# Patient Record
Sex: Female | Born: 1975 | Hispanic: Yes | Marital: Married | Smoking: Never smoker
Health system: Southern US, Community
[De-identification: ages and names within clinical notes are randomized; demographics above are authoritative.]

## PROBLEM LIST (undated history)

## (undated) DIAGNOSIS — M1990 Unspecified osteoarthritis, unspecified site: Secondary | ICD-10-CM

## (undated) DIAGNOSIS — I10 Essential (primary) hypertension: Secondary | ICD-10-CM

## (undated) HISTORY — PX: APPENDECTOMY: SHX54

---

## 2006-01-19 ENCOUNTER — Ambulatory Visit: Payer: Self-pay | Admitting: *Deleted

## 2006-01-19 ENCOUNTER — Ambulatory Visit: Payer: Self-pay | Admitting: Family Medicine

## 2006-02-08 ENCOUNTER — Encounter (INDEPENDENT_AMBULATORY_CARE_PROVIDER_SITE_OTHER): Payer: Self-pay | Admitting: Family Medicine

## 2006-02-08 ENCOUNTER — Ambulatory Visit: Payer: Self-pay | Admitting: Family Medicine

## 2006-12-15 ENCOUNTER — Ambulatory Visit: Payer: Self-pay | Admitting: Family Medicine

## 2007-02-16 ENCOUNTER — Ambulatory Visit: Payer: Self-pay | Admitting: Internal Medicine

## 2007-02-16 ENCOUNTER — Encounter (INDEPENDENT_AMBULATORY_CARE_PROVIDER_SITE_OTHER): Payer: Self-pay | Admitting: Internal Medicine

## 2007-02-16 ENCOUNTER — Encounter (INDEPENDENT_AMBULATORY_CARE_PROVIDER_SITE_OTHER): Payer: Self-pay | Admitting: Family Medicine

## 2007-04-27 ENCOUNTER — Ambulatory Visit: Payer: Self-pay | Admitting: Internal Medicine

## 2007-05-19 ENCOUNTER — Ambulatory Visit: Payer: Self-pay | Admitting: Family Medicine

## 2007-05-20 ENCOUNTER — Encounter (INDEPENDENT_AMBULATORY_CARE_PROVIDER_SITE_OTHER): Payer: Self-pay | Admitting: Nurse Practitioner

## 2007-05-20 LAB — CONVERTED CEMR LAB
Cholesterol: 233 mg/dL — ABNORMAL HIGH (ref 0–200)
Total CHOL/HDL Ratio: 4.4
Triglycerides: 172 mg/dL — ABNORMAL HIGH (ref <150)
VLDL: 34 mg/dL (ref 0–40)

## 2007-05-22 ENCOUNTER — Encounter (INDEPENDENT_AMBULATORY_CARE_PROVIDER_SITE_OTHER): Payer: Self-pay | Admitting: Family Medicine

## 2007-05-22 DIAGNOSIS — K219 Gastro-esophageal reflux disease without esophagitis: Secondary | ICD-10-CM

## 2007-05-22 DIAGNOSIS — G43009 Migraine without aura, not intractable, without status migrainosus: Secondary | ICD-10-CM | POA: Insufficient documentation

## 2007-05-22 DIAGNOSIS — E782 Mixed hyperlipidemia: Secondary | ICD-10-CM | POA: Insufficient documentation

## 2007-06-14 ENCOUNTER — Encounter (INDEPENDENT_AMBULATORY_CARE_PROVIDER_SITE_OTHER): Payer: Self-pay | Admitting: *Deleted

## 2009-09-10 ENCOUNTER — Telehealth (INDEPENDENT_AMBULATORY_CARE_PROVIDER_SITE_OTHER): Payer: Self-pay | Admitting: Internal Medicine

## 2009-09-25 ENCOUNTER — Encounter (INDEPENDENT_AMBULATORY_CARE_PROVIDER_SITE_OTHER): Payer: Self-pay | Admitting: *Deleted

## 2009-09-30 ENCOUNTER — Ambulatory Visit: Payer: Self-pay | Admitting: Internal Medicine

## 2009-09-30 DIAGNOSIS — J309 Allergic rhinitis, unspecified: Secondary | ICD-10-CM | POA: Insufficient documentation

## 2009-09-30 DIAGNOSIS — R5381 Other malaise: Secondary | ICD-10-CM

## 2009-09-30 DIAGNOSIS — R5383 Other fatigue: Secondary | ICD-10-CM

## 2009-09-30 LAB — CONVERTED CEMR LAB
ALT: 14 units/L (ref 0–35)
AST: 16 units/L (ref 0–37)
Albumin: 4.1 g/dL (ref 3.5–5.2)
Alkaline Phosphatase: 67 units/L (ref 39–117)
BUN: 8 mg/dL (ref 6–23)
Basophils Absolute: 0 10*3/uL (ref 0.0–0.1)
Basophils Relative: 0 % (ref 0–1)
CO2: 21 meq/L (ref 19–32)
Calcium: 8.9 mg/dL (ref 8.4–10.5)
Chloride: 106 meq/L (ref 96–112)
Cholesterol: 230 mg/dL — ABNORMAL HIGH (ref 0–200)
Creatinine, Ser: 0.58 mg/dL (ref 0.40–1.20)
Eosinophils Absolute: 0.2 10*3/uL (ref 0.0–0.7)
Eosinophils Relative: 3 % (ref 0–5)
Glucose, Bld: 95 mg/dL (ref 70–99)
HCT: 40 % (ref 36.0–46.0)
HDL: 55 mg/dL (ref >39)
Hemoglobin: 12.4 g/dL (ref 12.0–15.0)
LDL Cholesterol: 153 mg/dL — ABNORMAL HIGH (ref 0–99)
Lymphocytes Relative: 24 % (ref 12–46)
Lymphs Abs: 1.4 10*3/uL (ref 0.7–4.0)
MCHC: 31 g/dL (ref 30.0–36.0)
MCV: 85.7 fL (ref 78.0–100.0)
Monocytes Absolute: 0.4 10*3/uL (ref 0.1–1.0)
Monocytes Relative: 7 % (ref 3–12)
Neutro Abs: 3.8 10*3/uL (ref 1.7–7.7)
Neutrophils Relative %: 66 % (ref 43–77)
Platelets: 354 10*3/uL (ref 150–400)
Potassium: 4.2 meq/L (ref 3.5–5.3)
RBC: 4.67 M/uL (ref 3.87–5.11)
RDW: 14.9 % (ref 11.5–15.5)
Sodium: 140 meq/L (ref 135–145)
TSH: 0.935 microintl units/mL (ref 0.350–4.500)
Total Bilirubin: 0.4 mg/dL (ref 0.3–1.2)
Total CHOL/HDL Ratio: 4.2
Total Protein: 6.7 g/dL (ref 6.0–8.3)
Triglycerides: 109 mg/dL (ref <150)
VLDL: 22 mg/dL (ref 0–40)
WBC: 5.8 10*3/uL (ref 4.0–10.5)

## 2009-10-21 ENCOUNTER — Encounter (INDEPENDENT_AMBULATORY_CARE_PROVIDER_SITE_OTHER): Payer: Self-pay | Admitting: Internal Medicine

## 2009-11-12 ENCOUNTER — Ambulatory Visit: Payer: Self-pay | Admitting: Internal Medicine

## 2009-11-12 DIAGNOSIS — K1120 Sialoadenitis, unspecified: Secondary | ICD-10-CM

## 2009-12-16 ENCOUNTER — Ambulatory Visit: Payer: Self-pay | Admitting: Internal Medicine

## 2009-12-16 DIAGNOSIS — M25569 Pain in unspecified knee: Secondary | ICD-10-CM

## 2009-12-16 LAB — CONVERTED CEMR LAB
Blood in Urine, dipstick: NEGATIVE
Glucose, Urine, Semiquant: NEGATIVE
Nitrite: NEGATIVE
Pap Smear: NEGATIVE
Protein, U semiquant: NEGATIVE
Urobilinogen, UA: 0.2
WBC Urine, dipstick: NEGATIVE
Whiff Test: NEGATIVE

## 2009-12-21 ENCOUNTER — Encounter (INDEPENDENT_AMBULATORY_CARE_PROVIDER_SITE_OTHER): Payer: Self-pay | Admitting: Internal Medicine

## 2009-12-21 LAB — CONVERTED CEMR LAB: GC Probe Amp, Genital: NEGATIVE

## 2010-10-28 NOTE — Assessment & Plan Note (Signed)
Summary: cpp exam///gk   Vital Signs:  Patient profile:   35 year old female LMP:     12/09/2009 Weight:      175.25 pounds BMI:     31.66 Temp:     97.6 degrees F Pulse rate:   73 / minute Pulse rhythm:   regular Resp:     16 per minute BP sitting:   132 / 87  (left arm) Cuff size:   regular  Vitals Entered By: Chauncy Passy, SMA CC: Pt. is here for a 35 y/o CPP.  Is Patient Diabetic? No Pain Assessment Patient in pain? no       Does patient need assistance? Ambulation Normal LMP (date): 12/09/2009     Enter LMP: 12/09/2009 Last PAP Result Normal   CC:  Pt. is here for a 35 y/o CPP. Marland Kitchen  History of Present Illness: 35 yo female here for CPP.  Concerns:  1.  Allergies:  more symptoms today--runny nose and sneezing.  Not using Fexofenadine currently, but still has some.  Using nasal spray and Singulair daily.  2.  Right facial swelling:  Possible sialadenitis, but no pain with eating.  Resolved after 1 week.  Tolerated Augmentin fine.  Pt. states did have a "bubble " come up on her gum in that area and that drained pus.    Habits & Providers  Alcohol-Tobacco-Diet     Alcohol drinks/day: <1     Alcohol type: rare beer     Tobacco Status: never  Exercise-Depression-Behavior     Drug Use: never  Current Medications (verified): 1)  Fexofenadine Hcl 180 Mg Tabs (Fexofenadine Hcl) .Marland Kitchen.. 1 Tab By Mouth Daily As Needed Allergies 2)  Augmentin 875-125 Mg Tabs (Amoxicillin-Pot Clavulanate) .Marland Kitchen.. 1 Tab By Mouth Two Times A Day For 10 Days 3)  Singulair 10 Mg Tabs (Montelukast Sodium) .Marland Kitchen.. 1 Tab By Mouth Daily 4)  Nasacort Aq 55 Mcg/act Aers (Triamcinolone Acetonide(Nasal)) .... 2 Sprays Each Nostril Once Daily  Allergies (verified): No Known Drug Allergies  Past History:  Past Medical History: SIALADENITIS, RIGHT (ICD-527.2) ALLERGIC RHINITIS WITH CONJUNCTIVITIS (ICD-477.9) FATIGUE (ICD-780.79) HYPERLIPIDEMIA, MIXED (ICD-272.2) GERD (ICD-530.81) COMMON MIGRAINE  (ICD-346.10)  Past Surgical History: 1.  Appendectomy  Family History: Mother, died 71:  Stroke, hypertension. Father, died 44:  ?cancer--died of unknown respiratory illness, hypercholesterolemia, possible CAD 10 sisters, 1 died age 44:  seizure disorder--complications following a seizure. 1 Brother,   50+ , Hypercholesterolemia Daughter, 65:  Healthy  Social History: Moved to [place]. in 2003. 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 Lives at home with husband and daughter.Smoking Status:  never Drug Use:  never  Review of Systems General:  Energy generally fine.. Eyes:  Denies blurring. ENT:  Denies decreased hearing. CV:  Denies chest pain or discomfort; Rare episode of palpitation--seconds. Resp:  Denies shortness of breath; one time episode of difficulties getting breath--short lived.  When repositioned herself, was fine.Marland Kitchen GI:  Denies abdominal pain, bloody stools, constipation, dark tarry stools, and diarrhea. GU:  Denies discharge, dysuria, and urinary frequency; Periods regular.   LMP:  12/09/09.. MS:  Sometimes with pain and swelling of right elbow and left knee.  No erythenma.. Derm:  Denies lesion(s) and rash. Neuro:  Denies numbness, tingling, and weakness. Psych:  PHQ-9 with score of 3.  Physical Exam  General:  Well-developed,well-nourished,in no acute distress; alert,appropriate and cooperative throughout examination Head:  Normocephalic and atraumatic without obvious abnormalities. No apparent alopecia or balding. Eyes:  No corneal or conjunctival inflammation  noted. EOMI. Perrla. Funduscopic exam benign, without hemorrhages, exudates or papilledema. Vision grossly normal. Ears:  External ear exam shows no significant lesions or deformities.  Otoscopic examination reveals clear canals, tympanic membranes are intact bilaterally without bulging, retraction, inflammation or discharge. Hearing is grossly normal bilaterally. Nose:  External nasal examination shows no  deformity or inflammation. Nasal mucosa are pink and moist without lesions or exudates. Mouth:  Oral mucosa and oropharynx without lesions or exudates.  Teeth in good repair. Neck:  No deformities, masses, or tenderness noted. Breasts:  No mass, nodules, thickening, tenderness, bulging, retraction, inflamation, nipple discharge or skin changes noted.   Lungs:  Normal respiratory effort, chest expands symmetrically. Lungs are clear to auscultation, no crackles or wheezes. Heart:  Normal rate and regular rhythm. S1 and S2 normal without gallop, murmur, click, rub or other extra sounds. Abdomen:  Bowel sounds positive,abdomen soft and non-tender without masses, organomegaly or hernias noted. Genitalia:  Pelvic Exam:        External: normal female genitalia without lesions or masses        Vagina: normal without lesions or masses        Cervix: normal without lesions or masses        Adnexa: normal bimanual exam without masses or fullness        Uterus: normal by palpation        Pap smear: performed Msk:  No deformity or scoliosis noted of thoracic or lumbar spine.   Extremities:  No effusion or swelling of right elbow or left knee.  Full ROM.   MIld tenderness over medial epicondyle on right elbow.   Pt. points to tendons of hamstrings on lateral left knee as source of pain--nontender currently Neurologic:  No cranial nerve deficits noted. Station and gait are normal. Plantar reflexes are down-going bilaterally. DTRs are symmetrical throughout. Sensory, motor and coordinative functions appear intact. Skin:  Intact without suspicious lesions or rashes Cervical Nodes:  No lymphadenopathy noted Axillary Nodes:  No palpable lymphadenopathy Inguinal Nodes:  No significant adenopathy Psych:  Cognition and judgment appear intact. Alert and cooperative with normal attention span and concentration. No apparent delusions, illusions, hallucinations   Impression & Recommendations:  Problem # 1:   ROUTINE GYNECOLOGICAL EXAMINATION (ICD-V72.31) Monthly SBE Orders: KOH/ WET Mount [phone]) Pap Smear, Thin Prep ( Collection of) [phone]) UA Dipstick w/o Micro (manual) (09811) T- GC Chlamydia (91478) T-HIV Antibody  (Reflex) ([id number]) T-Pap Smear, Thin Prep (57846) T-Syphilis Test (RPR) (96295-28413)  Problem # 2:  SIALADENITIS, RIGHT (ICD-527.2) Resolved--more likely an abscessed tooth now with new history  Problem # 3:  KNEE PAIN, LEFT (ICD-719.46) ibuprofen as needed--believe this is tendonitis  Problem # 4:  ALLERGIC RHINITIS WITH CONJUNCTIVITIS (ICD-477.9) To start antihistamine Her updated medication list for this problem includes:    Xyzal 5 Mg Tabs (Levocetirizine dihydrochloride) .Marland Kitchen... 1 tab by mouth daily as needed allergies    Nasacort Aq 55 Mcg/act Aers (Triamcinolone acetonide(nasal)) .Marland Kitchen... 2 sprays each nostril once daily  Problem # 5:  HYPERLIPIDEMIA, MIXED (ICD-272.2) To work on diet and exercise and recheck.  Complete Medication List: 1)  Xyzal 5 Mg Tabs (Levocetirizine dihydrochloride) .Marland Kitchen.. 1 tab by mouth daily as needed allergies 2)  Singulair 10 Mg Tabs (Montelukast sodium) .Marland Kitchen.. 1 tab by mouth daily 3)  Nasacort Aq 55 Mcg/act Aers (Triamcinolone acetonide(nasal)) .... 2 sprays each nostril once daily  Other Orders: Tdap => 85yrs IM [phone]) Admin 1st Vaccine (02725) Admin 1st Vaccine [facility]) [phone])  Patient  Instructions: 1)  Ibuprofen 400-600 mg every 6 hours with food for elbow or knee pain--call if worsens 2)  Fasting lipids in 4 months--lab only 3)  physcial in 1 year-Dr. Delrae Alfred Prescriptions: XYZAL 5 MG TABS (LEVOCETIRIZINE DIHYDROCHLORIDE) 1 tab by mouth daily as needed allergies  #30 x 11   Entered and Authorized by:   Julieanne Manson MD   Signed by:   Julieanne Manson MD on 12/16/2009   Method used:   Faxed to ...       [facility] (retail)       [address] [place], [place]  [address]        Ph: [phone]       Fax: [phone]   RxID:   [id number]   Laboratory Results   Urine Tests    Routine Urinalysis   Glucose: negative   (Normal Range: Negative) Bilirubin: negative   (Normal Range: Negative) Ketone: negative   (Normal Range: Negative) Spec. Gravity: 1.010   (Normal Range: 1.003-1.035) Blood: negative   (Normal Range: Negative) pH: 5.5   (Normal Range: 5.0-8.0) Protein: negative   (Normal Range: Negative) Urobilinogen: 0.2   (Normal Range: 0-1) Nitrite: negative   (Normal Range: Negative) Leukocyte Esterace: negative   (Normal Range: Negative)      Wet Mount Source: vaginal WBC/hpf: 1-5 Bacteria/hpf: 1+ Clue cells/hpf: none  Negative whiff Yeast/hpf: none Wet Mount KOH: Negative Trichomonas/hpf: none    Preventive Care Screening  Prior Values:    Pap Smear:  Normal (02/16/2007)     Pap:  always normal SBE:  monthly--no changes. Osteoprevention:  2 + servings of dairy daily.  Walks daily.   Tetanus/Td Vaccine    Vaccine Type: Tdap    Site: right deltoid    Mfr: Sanofi Pasteur    Dose: 0.5 ml    Route: IM    Given by: Chauncy Passy, SMA    Exp. Date: 01/02/2012    Lot #: [id number]    VIS given: 08/15/07 version given December 16, 2009.   Laboratory Results   Urine Tests    Routine Urinalysis   Glucose: negative   (Normal Range: Negative) Bilirubin: negative   (Normal Range: Negative) Ketone: negative   (Normal Range: Negative) Spec. Gravity: 1.010   (Normal Range: 1.003-1.035) Blood: negative   (Normal Range: Negative) pH: 5.5   (Normal Range: 5.0-8.0) Protein: negative   (Normal Range: Negative) Urobilinogen: 0.2   (Normal Range: 0-1) Nitrite: negative   (Normal Range: Negative) Leukocyte Esterace: negative   (Normal Range: Negative)      Wet Mount/KOH  Negative whiff   Appended Document: cpp exam///gk  Laboratory Results    Other Tests  Rapid HIV: negative

## 2010-10-28 NOTE — Assessment & Plan Note (Signed)
Summary: allergies/ abdominal pain/ nasal congestion//gk   Vital Signs:  Patient profile:   35 year old female Height:      62.5 inches Weight:      179 pounds BMI:     32.33 Temp:     98.0 degrees F Pulse rate:   84 / minute Pulse rhythm:   regular Resp:     18 per minute BP sitting:   135 / 86  (left arm) Cuff size:   regular  Vitals Entered By: Vesta Mixer CMA (September 30, 2009 9:37 AM) CC: Allergies 1-2 times a weeks.  Eyes watery, sneezing, runny nose and face feels hot for about 2 months.  Has h/o high cholesterol, she is fasting this morning.  It's been about 2 years since her last pap. Is Patient Diabetic? No Pain Assessment Patient in pain? no       Does patient need assistance? Ambulation Normal   CC:  Allergies 1-2 times a weeks.  Eyes watery, sneezing, runny nose and face feels hot for about 2 months.  Has h/o high cholesterol, and she is fasting this morning.  It's been about 2 years since her last pap.Marland Kitchen  History of Present Illness: 35 yo female I am seeing for first time in probably 2 1/2 years.  1.  Allergies:  Clear nasal discharge, face gets hot, sinus pressure and discomfort--head feels stuffed up.  Posterior pharyngeal drainage also, sometimes cough, does have itchy watery eyes and sneezing as well.  Currently not having much in way of symptoms.  Generally has symptoms in the summer, but in last 2 months -- more.  Really has not had problems with this until this year.  Benadryl does help, but does not like side effects.    2.  Hypercholesterolemia:  would like to check today.  3.  Needs CPP for pap--see above.  4.  Fatigue:  brings up at end of visit--has had for 2-3 years--always tired.  Periods getting heavier.  Allergies (verified): No Known Drug Allergies  Physical Exam  General:  NAD Eyes:  No injection. Ears:  External ear exam shows no significant lesions or deformities.  Otoscopic examination reveals clear canals, tympanic membranes are  intact bilaterally without bulging, retraction, inflammation or discharge. Hearing is grossly normal bilaterally. Nose:  mucosal edema.   Mouth:  pharynx pink and moist.   Neck:  No deformities, masses, or tenderness noted. Lungs:  Normal respiratory effort, chest expands symmetrically. Lungs are clear to auscultation, no crackles or wheezes. Heart:  Normal rate and regular rhythm. S1 and S2 normal without gallop, murmur, click, rub or other extra sounds.   Impression & Recommendations:  Problem # 1:  ALLERGIC RHINITIS WITH CONJUNCTIVITIS (ICD-477.9) Start with Fexofendadine Her updated medication list for this problem includes:    Fexofenadine Hcl 180 Mg Tabs (Fexofenadine hcl) .Marland Kitchen... 1 tab by mouth daily as needed allergies  Problem # 2:  HYPERLIPIDEMIA, MIXED (ICD-272.2)  Orders: T-Lipid Profile ([id number])  Problem # 3:  FATIGUE (ICD-780.79) Address more fully at CPP Orders: T-Comprehensive Metabolic Panel (09811-91478) T-CBC w/Diff (29562-13086) T-TSH (57846-96295)  Complete Medication List: 1)  Fexofenadine Hcl 180 Mg Tabs (Fexofenadine hcl) .Marland Kitchen.. 1 tab by mouth daily as needed allergies  Patient Instructions: 1)  Next available with Dr. Delrae Alfred for CPP Prescriptions: FEXOFENADINE HCL 180 MG TABS (FEXOFENADINE HCL) 1 tab by mouth daily as needed allergies  #30 x 11   Entered and Authorized by:   Julieanne Manson MD   Signed by:  Julieanne Manson MD on 09/30/2009   Method used:   Faxed to ...       [facility] (retail)       [address] [place], [place]  [address]       Ph: [phone]       Fax: [phone]   RxID:   [id number]

## 2010-10-28 NOTE — Assessment & Plan Note (Signed)
Summary: ALLERGIES//GK   Vital Signs:  Patient profile:   35 year old female Weight:      179 pounds Temp:     97.6 degrees F Pulse rate:   80 / minute Pulse rhythm:   regular Resp:     18 per minute BP sitting:   131 / 84  (left arm) Cuff size:   regular  Vitals Entered By: Vesta Mixer CMA (November 12, 2009 11:36 AM) CC: sneezing, watery eyes, lots of nasal congestion, was here 09/30/09  for same thing, but no better.  Allegra has not helped that she can tell. Is Patient Diabetic? No Pain Assessment Patient in pain? no       Does patient need assistance? Ambulation Normal   CC:  sneezing, watery eyes, lots of nasal congestion, was here 09/30/09  for same thing, and but no better.  Allegra has not helped that she can tell.Marland Kitchen  History of Present Illness: 1.  Allergic Rhinitis:  no improvement with Allegra.  States her symptoms just seem to come and go--even throughout a day.  Later, becomes evident she has been using Afrin, but not clear that she is using it very frequently.  Her main concern is the nasal congestion--having to breath through her mouth.  Lips getting very dry because of this.  2.  Hyperlipidemia:    3.  Health maintenance:  Has an appt. 3/22 for CPP.   4.  Some swelling in right cheek, but no toothache or other pain.  Has noted for past 2-3 days.   No fever  Allergies (verified): No Known Drug Allergies  Physical Exam  Eyes:  No conjunctival injection Ears:  External ear exam shows no significant lesions or deformities.  Otoscopic examination reveals clear canals, tympanic membranes are intact bilaterally without bulging, retraction, inflammation or discharge. Hearing is grossly normal bilaterally. Nose:  nasal dischargemucosal pallor.   Mouth:  Tender thin thickening running from maxillary area of right cheek to opening of parotid duct opening.  No drainage from parotid duct opening. Neck:  No deformities, masses, or tenderness noted. Lungs:  Normal  respiratory effort, chest expands symmetrically. Lungs are clear to auscultation, no crackles or wheezes. Heart:  Normal rate and regular rhythm. S1 and S2 normal without gallop, murmur, click, rub or other extra sounds.   Impression & Recommendations:  Problem # 1:  ALLERGIC RHINITIS WITH CONJUNCTIVITIS (ICD-477.9) Stop Afrin Her updated medication list for this problem includes:    Fexofenadine Hcl 180 Mg Tabs (Fexofenadine hcl) .Marland Kitchen... 1 tab by mouth daily as needed allergies    Nasacort Aq 55 Mcg/act Aers (Triamcinolone acetonide(nasal)) .Marland Kitchen... 2 sprays each nostril once daily  Problem # 2:  HYPERLIPIDEMIA, MIXED (ICD-272.2) To work on diet and exercise. Repeat 4 months Orders: Nutrition Referral (Nutrition)  Problem # 3:  SIALADENITIS, RIGHT (ICD-527.2) Augmentins and warm packs--not clear this is the actual problem, but does seem to have swelling along parotid duct. To call if does not resolve Eat lemons  Complete Medication List: 1)  Fexofenadine Hcl 180 Mg Tabs (Fexofenadine hcl) .Marland Kitchen.. 1 tab by mouth daily as needed allergies 2)  Augmentin 875-125 Mg Tabs (Amoxicillin-pot clavulanate) .Marland Kitchen.. 1 tab by mouth two times a day for 10 days 3)  Singulair 10 Mg Tabs (Montelukast sodium) .Marland Kitchen.. 1 tab by mouth daily 4)  Nasacort Aq 55 Mcg/act Aers (Triamcinolone acetonide(nasal)) .... 2 sprays each nostril once daily  Patient Instructions: 1)  Keep appt. in March with Dr. Delrae Alfred 2)  Stop  Afrin Prescriptions: NASACORT AQ 55 MCG/ACT AERS (TRIAMCINOLONE ACETONIDE(NASAL)) 2 sprays each nostril once daily  #1 month x 11   Entered and Authorized by:   Julieanne Manson MD   Signed by:   Julieanne Manson MD on 11/12/2009   Method used:   Faxed to ...       [facility] (retail)       [address] [place], [place]  [address]       Ph: [phone]       Fax: [phone]   RxID:   [id number] SINGULAIR 10 MG TABS (MONTELUKAST SODIUM) 1  tab by mouth daily  #30 x 11   Entered and Authorized by:   Julieanne Manson MD   Signed by:   Julieanne Manson MD on 11/12/2009   Method used:   Faxed to ...       [facility] (retail)       [address] [place], [place]  [address]       Ph: [phone]       Fax: [phone]   RxID:   [id number] AUGMENTIN 875-125 MG TABS (AMOXICILLIN-POT CLAVULANATE) 1 tab by mouth two times a day for 10 days  #20 x 0   Entered and Authorized by:   Julieanne Manson MD   Signed by:   Julieanne Manson MD on 11/12/2009   Method used:   Faxed to ...       [facility] (retail)       [address] [place], [place]  [address]       Ph: [phone]       Fax: [phone]   RxID:   [id number]

## 2010-10-28 NOTE — Letter (Signed)
Summary: Lipid Letter  [facility]  [address] [place], [place] [address]   Phone: [phone]  Fax: [phone]    10/21/2009  Kayla Patton [address] [place], [place]  [address]  Dear Kayla Patton:  We have carefully reviewed your last lipid profile from 09/30/2009 and the results are noted below with a summary of recommendations for lipid management.    Cholesterol:       230     Goal: <200   HDL "good" Cholesterol:   55     Goal: >45   LDL "bad" Cholesterol:   153     Goal: <100   Triglycerides:       109     Goal: <150    Your cholesterol is high, the rest of your labs were okay--no findings for fatigue.  Will discuss more when you come in for your physical/pap    TLC Diet (Therapeutic Lifestyle Change): Saturated Fats & Transfatty acids should be kept < 7% of total calories ***Reduce Saturated Fats Polyunstaurated Fat can be up to 10% of total calories Monounsaturated Fat Fat can be up to 20% of total calories Total Fat should be no greater than 25-35% of total calories Carbohydrates should be 50-60% of total calories Protein should be approximately 15% of total calories Fiber should be at least 20-30 grams a day ***Increased fiber may help lower LDL Total Cholesterol should be < 200mg /day Consider adding plant stanol/sterols to diet (example: Benacol spread) ***A higher intake of unsaturated fat may reduce Triglycerides and Increase HDL    Adjunctive Measures (may lower LIPIDS and reduce risk of Heart Attack) include: Aerobic Exercise (20-30 minutes 3-4 times a week) Limit Alcohol Consumption Weight Reduction Aspirin 75-81 mg a day by mouth (if not allergic or contraindicated) Dietary Fiber 20-30 grams a day by mouth     Current Medications: 1)    Fexofenadine Hcl 180 Mg Tabs (Fexofenadine hcl) .Marland Kitchen.. 1 tab by mouth daily as needed allergies  If you have any questions, please call. We appreciate being able to work with you.    Sincerely,    [facility]

## 2010-10-28 NOTE — Progress Notes (Signed)
Summary: Office Visit//DEPRESSION SCREENING  Office Visit//DEPRESSION SCREENING   Imported By: Arta Bruce 02/19/2010 15:28:03  _____________________________________________________________________  External Attachment:    Type:   Image     Comment:   External Document

## 2010-10-28 NOTE — Letter (Signed)
Summary: *HSN Results Follow up  [facility]  [address] [place], [place] [address]   Phone: [phone]  Fax: [phone]      12/21/2009   [facility] RENTERIA ARZATE [address] [place], [place]  [address]   Dear  Ms. Nysia RENTERIA ARZATE,                            ____S.Drinkard,FNP   ____D. Gore,FNP       ____B. McPherson,MD   ____V. Rankins,MD    _X___E. Tongela Encinas,MD    ____N. Daphine Deutscher, FNP  ____D. Reche Dixon, MD    ____K. Philipp Deputy, MD    ____Other     This letter is to inform you that your recent test(s):  _____X__Pap Smear    ___X____Lab Test     _______X-ray    ___X____ is within acceptable limits  _______ requires a medication change  _______ requires a follow-up lab visit  _______ requires a follow-up visit with your provider   Comments:       _________________________________________________________ If you have any questions, please contact our office                     Sincerely,  Julieanne Manson MD [facility]

## 2014-06-03 ENCOUNTER — Emergency Department (HOSPITAL_COMMUNITY): Payer: Self-pay

## 2014-06-03 ENCOUNTER — Encounter (HOSPITAL_COMMUNITY): Payer: Self-pay | Admitting: Emergency Medicine

## 2014-06-03 ENCOUNTER — Emergency Department (HOSPITAL_COMMUNITY)
Admission: EM | Admit: 2014-06-03 | Discharge: 2014-06-04 | Disposition: A | Discharge status: Home / Self Care | Payer: Self-pay | Attending: Emergency Medicine | Admitting: Emergency Medicine

## 2014-06-03 DIAGNOSIS — M7120 Synovial cyst of popliteal space [Baker], unspecified knee: Secondary | ICD-10-CM | POA: Insufficient documentation

## 2014-06-03 DIAGNOSIS — Z79899 Other long term (current) drug therapy: Secondary | ICD-10-CM | POA: Insufficient documentation

## 2014-06-03 DIAGNOSIS — M25569 Pain in unspecified knee: Secondary | ICD-10-CM | POA: Insufficient documentation

## 2014-06-03 DIAGNOSIS — M25461 Effusion, right knee: Secondary | ICD-10-CM

## 2014-06-03 DIAGNOSIS — M129 Arthropathy, unspecified: Secondary | ICD-10-CM | POA: Insufficient documentation

## 2014-06-03 DIAGNOSIS — M7121 Synovial cyst of popliteal space [Baker], right knee: Secondary | ICD-10-CM

## 2014-06-03 DIAGNOSIS — M25561 Pain in right knee: Secondary | ICD-10-CM

## 2014-06-03 DIAGNOSIS — I10 Essential (primary) hypertension: Secondary | ICD-10-CM | POA: Insufficient documentation

## 2014-06-03 DIAGNOSIS — Z791 Long term (current) use of non-steroidal anti-inflammatories (NSAID): Secondary | ICD-10-CM | POA: Insufficient documentation

## 2014-06-03 DIAGNOSIS — E669 Obesity, unspecified: Secondary | ICD-10-CM | POA: Insufficient documentation

## 2014-06-03 HISTORY — DX: Essential (primary) hypertension: I10

## 2014-06-03 HISTORY — DX: Unspecified osteoarthritis, unspecified site: M19.90

## 2014-06-03 MED ORDER — OXYCODONE-ACETAMINOPHEN 5-325 MG PO TABS
1.0000 | ORAL_TABLET | Freq: Once | ORAL | Status: AC
  Administered 2014-06-03: 1 via ORAL
  Filled 2014-06-03: qty 1

## 2014-06-03 NOTE — ED Notes (Signed)
Per pt family pt has been having right knee pain and swelling x 2 weeks. sts hx of arthritis.

## 2014-06-03 NOTE — ED Provider Notes (Signed)
CSN: [id number]     Arrival date & time 06/03/14  1810 History   First MD Initiated Contact with Patient 06/03/14 2259     This chart was scribed for non-physician practitioner, Kayla Pontes PA-C  working with Kayla Patton, * by Kayla Patton, ED Scribe. This patient was seen in room TR07C/TR07C and the patient's care was started at 1:02 AM.   Chief Complaint  Patient presents with  . Knee Pain   Patient is a 38 y.o. female presenting with knee pain. The history is provided by the patient. A language interpreter was used (provider).  Knee Pain Location:  Knee Injury: no   Knee location:  L knee and R knee (mostly R knee, but ongoing pain in L knee) Pain details:    Quality:  Aching   Radiates to:  Does not radiate   Severity:  Moderate   Onset quality:  Gradual   Duration:  2 weeks   Timing:  Constant   Progression:  Unchanged Chronicity:  Recurrent Dislocation: no   Foreign body present:  No foreign bodies Prior injury to area:  No Relieved by:  Rest and elevation Worsened by:  Bearing weight and activity Ineffective treatments:  Heat, ice and arthritis medication Associated symptoms: stiffness (lasting approx 30 mins in the AM, then improves, then worsens by the end of the day) and swelling   Associated symptoms: no back pain, no decreased ROM, no fever, no muscle weakness, no numbness and no tingling   Risk factors: obesity     HPI Comments: Kayla Patton is a 38 y.o. female with a PMHx of arthritis and HTN who presents to the Emergency Department complaining of constant, achy, non-radiating, moderate R knee pain with associated swelling x 2 weeks. Ms. Kayla Patton has also noted swelling to the back of the R knee with associated pain, but states this area is the same size as it always has been. She denies any injury or trauma. Pain is exacerbated with ambulation/weight bearing after about 2-3 hours of being on her feet, and alleviated with Percocet given  in triage as well as rest and elevation. Currently rates pain 9/10 with ambulation, but at rest it is mild. She has tried ice and heat application at home without any improvement for symptoms, as well as her celebrex which has not helped much. Endorses morning stiffness lasting approx 72mins and then subsides but returns after a full day. She denies any warmth or erythema to the joint. She denies any fever, chills, red streaking, numbness, tingling, weakness, decreased ROM, LE swelling, back pain, nausea, or vomiting. She reports chronic pain to L knee for approximately 2 years now. Knee was injected several years ago, however, swelling has never subsided full. Pt takes Celebrex daily for arthritis. No known allergies to medications. She has no otther pertinent past medical history. No other concerns this visit.   She is followed by [facility] for Arthritis and her medical issues.  Past Medical History  Diagnosis Date  . Arthritis   . Hypertension    Past Surgical History  Procedure Laterality Date  . Appendectomy     History reviewed. No pertinent family history. History  Substance Use Topics  . Smoking status: Never Smoker   . Smokeless tobacco: Not on file  . Alcohol Use: No   OB History   Grav Para Term Preterm Abortions TAB SAB Ect Mult Living  Review of Systems  Constitutional: Negative for fever and chills.  Cardiovascular: Negative for leg swelling.  Gastrointestinal: Negative for nausea and vomiting.  Musculoskeletal: Positive for arthralgias, joint swelling and stiffness (lasting approx 30 mins in the AM, then improves, then worsens by the end of the day). Negative for back pain, gait problem and myalgias.  Skin: Negative for color change.  Neurological: Negative for weakness and numbness.  A complete 10 system review of systems was obtained and all systems are negative except as noted in the HPI and PMH.     Allergies  Review of patient's  allergies indicates no known allergies.  Home Medications   Prior to Admission medications   Medication Sig Start Date End Date Taking? Authorizing Provider  celecoxib (CELEBREX) 100 MG capsule Take 100 mg by mouth daily.   Yes Historical Provider, MD  Cholecalciferol (VITAMIN D PO) Take 1 tablet by mouth daily.   Yes Historical Provider, MD  diclofenac sodium (VOLTAREN) 1 % GEL Apply 4 g topically 4 (four) times daily. 06/04/14   Kayla Higham Strupp Camprubi-Soms, PA-C  oxyCODONE-acetaminophen (PERCOCET) 5-325 MG per tablet Take 1-2 tablets by mouth every 6 (six) hours as needed for severe pain. 06/04/14   Kayla Knickerbocker Strupp Camprubi-Soms, PA-C   Triage Vitals: BP 139/81  Pulse 97  Temp(Src) 98.7 F (37.1 C)  Resp 18  Wt 168 lb 6 oz (76.374 kg)  SpO2 100%  LMP 05/24/2014   Physical Exam  Nursing note and vitals reviewed. Constitutional: She is oriented to person, place, and time. Vital signs are normal. She appears well-developed and well-nourished. No distress.  Obese hispanic female in NAD, VSS, afebrile  HENT:  Head: Normocephalic and atraumatic.  Mouth/Throat: Mucous membranes are normal.  Eyes: Conjunctivae and EOM are normal.  Neck: Normal range of motion. Neck supple.  Cardiovascular: Normal rate and intact distal pulses.   Intact distal pulses, brisk cap refill  Pulmonary/Chest: Effort normal. No respiratory distress.  Abdominal: Normal appearance. She exhibits no distension.  Musculoskeletal: Normal range of motion.       Right knee: She exhibits effusion. She exhibits normal range of motion, no swelling, no deformity, no erythema, normal alignment, no LCL laxity, normal patellar mobility, no bony tenderness, normal meniscus and no MCL laxity. Tenderness found. Medial joint line and lateral joint line tenderness noted.       Legs: R knee with moderate effusion and TTP along joint line bilaterally, FROM intact, no deformity or erythema, no warmth. Good alignment without varus/valgus  laxity, no bony TTP, neg mcmurrys, neg ant/post drawer test, no abnormal patellar movement. Baker's cyst located posteriorly, approx 4cm in diameter, mildly tender without skin changes. Strength 5/5 in all extremities, sensation grossly intact, gait mildly antalgic  Neurological: She is alert and oriented to person, place, and time. She has normal strength. No sensory deficit. Gait (antalgic) abnormal.  Skin: Skin is warm, dry and intact. No rash noted. No erythema.  Psychiatric: She has a normal mood and affect.    ED Course  ARTHOCENTESIS Date/Time: 06/04/2014 12:15 AM Performed by: Kayla Patton STRUPP Authorized by: Corine Shelter Consent: Verbal consent obtained. Risks and benefits: risks, benefits and alternatives were discussed Consent given by: patient Patient understanding: patient states understanding of the procedure being performed Patient consent: the patient's understanding of the procedure matches consent given Patient identity confirmed: verbally with patient Indications: joint swelling and pain  Body area: knee Joint: right knee Local anesthesia used: yes Local anesthetic: lidocaine spray Patient sedated: no  Preparation: Patient was prepped and draped in the usual sterile fashion. Needle gauge: 18 G Ultrasound guidance: no Approach: anterior Aspirate: yellow and clear Aspirate amount: 35 ml Patient tolerance: Patient tolerated the procedure well with no immediate complications.   (including critical care time)  DIAGNOSTIC STUDIES: Oxygen Saturation is 100% on RA, Normal by my interpretation.    COORDINATION OF CARE: 1:02 AM- Will give Percocet. Will order DG knee complete 4 view R. Advised pt to follow up with Orthopedics for further evaluation. Discussed treatment plan with pt at bedside and pt agreed to plan.     Labs Review Labs Reviewed  BODY FLUID CULTURE  GRAM STAIN  SYNOVIAL CELL COUNT + DIFF, W/ CRYSTALS    Imaging  Review Dg Knee Complete 4 Views Right  06/03/2014   CLINICAL DATA:  Right knee pain 1 day.  No injury.  EXAM: RIGHT KNEE - COMPLETE 4+ VIEW  COMPARISON:  None.  FINDINGS: No evidence of fracture or dislocation. No significant degenerative changes. Suggestion of a joint effusion.  IMPRESSION: No acute fracture.  Suggestion of a joint effusion.   Electronically Signed   By: Marin Olp M.D.   On: 06/03/2014 19:23     EKG Interpretation None      MDM   Final diagnoses:  Right knee pain  Knee joint effusion, right  Baker's cyst of knee, right    38y/o female with right knee pain. Describes arthritic type pain, no acute trauma. X-ray negative aside from a joint effusion. Pt opted for arthrocentesis for pain relief. Synovial fluid was drained approximately 35 mL of straw-colored fluid, does not appear to be infectious in nature. Sent for analysis, but pt safe to be discharged prior to results, given that her PCP has checked for gout and RA already and fluid did not appear infectious. Patient's pain improved after the tap was performed, as well as after Percocet given in the ER. Patient was advised to use home Celebrex, as well as small supply of Percocet given to her at discharge for pain, and voltaren gel. Discussed use of heat and ice for pain. Discussed use of knee sleeve, patient declined wanting crutches. We'll give her a work note for 2 days. Discussed followup with orthopedics for ongoing management of her chronic knee pain. I explained the diagnosis and have given explicit precautions to return to the ER including for any other new or worsening symptoms. The patient understands and accepts the medical plan as it's been dictated and I have answered their questions. Discharge instructions concerning home care and prescriptions have been given. The patient is STABLE and is discharged to home in good condition.   I personally performed the services described in this documentation, which was  scribed in my presence. The recorded information has been reviewed and is accurate.  BP 139/81  Pulse 97  Temp(Src) 98.7 F (37.1 C)  Resp 18  Wt 168 lb 6 oz (76.374 kg)  SpO2 100%  LMP 05/24/2014  Meds ordered this encounter  Medications  . oxyCODONE-acetaminophen (PERCOCET/ROXICET) 5-325 MG per tablet 1 tablet    Sig:   . oxyCODONE-acetaminophen (PERCOCET) 5-325 MG per tablet    Sig: Take 1-2 tablets by mouth every 6 (six) hours as needed for severe pain.    Dispense:  10 tablet    Refill:  0    Order Specific Question:  Supervising Provider    Answer:  Noemi Chapel D [[id number]]  . diclofenac sodium (VOLTAREN) 1 % GEL  Sig: Apply 4 g topically 4 (four) times daily.    Dispense:  100 g    Refill:  1    Order Specific Question:  Supervising Provider    Answer:  Johnna Acosta [address] Camprubi-Soms, PA-C 06/04/14 0104

## 2014-06-04 LAB — GRAM STAIN

## 2014-06-04 LAB — SYNOVIAL CELL COUNT + DIFF, W/ CRYSTALS
Crystals, Fluid: NONE SEEN
Lymphocytes-Synovial Fld: 5 % (ref 0–20)
Monocyte-Macrophage-Synovial Fluid: 15 % — ABNORMAL LOW (ref 50–90)
Neutrophil, Synovial: 80 % — ABNORMAL HIGH (ref 0–25)
WBC, Synovial: 4776 /mm3 — ABNORMAL HIGH (ref 0–200)

## 2014-06-04 MED ORDER — OXYCODONE-ACETAMINOPHEN 5-325 MG PO TABS
1.0000 | ORAL_TABLET | Freq: Four times a day (QID) | ORAL | Status: DC | PRN
Start: 2014-06-04 — End: 2014-07-26

## 2014-06-04 MED ORDER — DICLOFENAC SODIUM 1 % TD GEL: 4.0000 g | Freq: Four times a day (QID) | TRANSDERMAL | Status: DC

## 2014-06-04 NOTE — Discharge Instructions (Signed)
Wear knee sleeve for comfort. Ice and elevate knee throughout the day. Use home celebrex as directed by your doctor, and use percocet for additional pain relief. Do not drive or operate machinery with pain medication use. Use voltaren gel as directed to help with inflammatory pain. Call orthopedic follow up today or tomorrow to schedule followup appointment for recheck of ongoing knee pain in one to two weeks for further evaluation and management of your chronic knee pain. Return to the ER for changes or worsening symptoms.   Lquido en la rodilla (Knee Effusion) Usted tiene lquido en la rodilla. El trmino mdico para esto es efusin. Esto a menudo se debe a un trastorno interno de la rodilla. Esto significa que algo no anda bien en esa articulacin. Algunas de las causas del lquido en la rodilla pueden ser un cartlago desgarrado, un desgarro de una parte de un ligamento o hemorragia en la articulacin debido a una lesin. Cuando esto ocurre, es ms difcil mover o doblar la rodilla. Esto se debe a que a menudo Engineer, water y la presin en la articulacin. El tiempo que tarda en recuperarse de una efusin de rodilla depende de diferentes factores, que incluyen:  Tipo de lesin  Edad  Enfermedades fsicas y mdicas.  Su determinacin El Company secretary sin Optometrist sus actividades normales depender del tipo de problema que sufra en la rodilla y la cantidad de tejido lesionado. La rodilla tiene dos capas de Database administrator. El cartlago articular cubre el extremo del hueso y le permite a la rodilla doblarse y moverse suavemente. Dos meniscos (gruesas almohadillas de cartlago que forman un armazn dentro de la articulacin) absorben los impactos y estabilizan la rodilla. Los ligamentos unen los [employer] s y Glass blower/designer. Los msculos mueven la articulacin, ayudan a Magazine features editor rodilla y se tensan en la articulacin misma. CAUSAS A menudo un derrame articular en la rodilla  est ocasionado por una lesin en uno de los meniscos. Este es a menudo un pequeo Company secretary. La recuperacin luego de una lesin en los meniscos depende del tamao de la lesin, y de si se ha daado otro tejido de la rodilla. Los pequeos desgarros generalmente se curan solos con Scientist, clinical (histocompatibility and immunogenetics). Conservador significa reposo y no Optometrist ninguna actividad que suponga Database administrator. La recuperacin puede demorar hasta seis semanas.  TRATAMIENTO Los desgarros graves pueden requerir [place]. Las [employer] meniscos pueden tratarse durante la artroscopia. La artroscopa es un procedimiento en el cual el cirujano utiliza un pequeo instrumento parecido a un telescopio para observar la rodilla. El profesional podr Geophysical data processor (determinar cul es el problema) ms preciso realizando una artroscopia. El profesional que lo asiste tambin podr Chief Technology Officer durante la artroscopa. Si la lesin es en el borde interno del menisco, el cirujano podr recortar el menisco hasta una zona sana. En otros casos, el cirujano tratar de reparar el menisco lesionado con una sutura (puntos). Esto podr hacer que la rehabilitacin tome ms tiempo, pero podr proveer un mejor estado de salud a largo plazo y Air traffic controller a que la rodilla conserve su capacidad de Research officer, trade union. Los ligamentos desgarrados por completo normalmente requieren [place] para su reparacin. [place] muletas segn las instrucciones.  Si le colocan un aparato ortopdico, selo como le han indicado.  Una vez que se encuentre en su hogar, aplique una bolsa de hielo en la zona de la [place], lo que puede ayudarle  a disminuir las [employer] y Horticulturist, commercial.  Mantenga la rodilla elevada (hacia arriba) cuando no est [place].  Utilice los medicamentos de venta libre o de prescripcin para Conservation officer, historic buildings, Health and safety inspector o la [place], segn  se lo indique el profesional que lo asiste.  El profesional que lo asiste lo ayudar con indicaciones para la rehabilitacin de la rodilla. Esto a menudo incluye ejercicios de estiramiento.  Puede reanudar su dieta y sus actividades normales segn se le haya indicado. SOLICITE ATENCIN MDICA SI:  Aumenta la inflamacin en la rodilla.  Presenta enrojecimiento, hinchazn o aumento del dolor en la rodilla.  Presenta una temperatura oral superior a 38,9 C (102 F). SOLICITE ATENCIN MDICA DE INMEDIATO SI:  Aparece una erupcin cutnea.  Presenta dificultades respiratorias.  Tiene alguna reaccin alrgica a los medicamentos que se le hayan administrado.  Siente dolor intenso al mover la rodilla. EST SEGURO QUE:   Comprende las instrucciones para el alta mdica.  Controlar su enfermedad.  Solicitar atencin mdica de inmediato segn las indicaciones. Document Released: 12/21/2007 Document Revised: 12/06/2011 [facility] Patient Information 2015 [place]. This information is not intended to replace advice given to you by your health care provider. Make sure you discuss any questions you have with your health care provider.  Artrocentesis de rodilla (Knee Arthrocentesis) La artrocentesis es un procedimiento que sirve para eliminar lquido de Insurance claims handler. El procedimiento se [place] para eliminar cantidades molestas de lquido de [place] articulacin o para obtener una muestra del lquido de la articulacin para anlisis. El anlisis del lquido de la articulacin sirve para que el mdico descubra la causa del dolor o la hinchazn que tiene en la articulacin. El lquido se forma en las articulaciones por una infeccin o por la gota, entre [place], y produce dolor o hinchazn.  INFORME A SU MDICO SOBRE:   Alergias.  Medicamentos que toma, entre ellos, hierbas, gotas oftlmicas, medicamentos de Artemus y Proofreader.  Uso de corticoides (por va oral o  cremas).  Problemas anteriores relacionados con anestsicos o novocana.  Antecedentes de cogulos sanguneos (tromboflebitis).  Antecedentes de hemorragias o problemas sanguneos.  Cirugas previas.  Otros problemas de [place]. RIESGOS Y COMPLICACIONES  La anestesia local podra no llegar a adormecer el rea lo suficiente y podra sentir una molestia leve. En casos aislados, podra tener una reaccin alrgica al frmaco usado para adormecer la piel.  Podra formarse ms lquido en la articulacin.  Podra tener una infeccin o hemorragia. ANTES DEL PROCEDIMIENTO  Lave toda la piel alrededor de la zona de la rodilla. Trate de retirar la piel suelta. No es necesario ninguna otra preparacin especfica excepto que el mdico le indique otra cosa. DESPUS DEL PROCEDIMIENTO   Podr volver a su casa despus del procedimiento.  Tal vez necesite aplicarse hielo en la articulacin durante 15 a 20 minutos, 3 a 4 veces al Patent examiner.  Podra ser necesario que coloque un vendaje elstico en la articulacin. INSTRUCCIONES PARA EL CUIDADO EN EL HOGAR   Utilice los medicamentos de venta libre o recetados para Glass blower/designer, Health and safety inspector o la fiebre, segn se lo indique el mdico.  Evite el estrs sobre la articulacin. Salvo que le indiquen lo contrario, evite correr, Catering manager, hacer alpinismo recreativo o caminatas y otras actividades que ejerzan mucha presin en la articulacin de la rodilla.  Recustese y [employer] la pierna/rodilla sobre el nivel del corazn para [place]. SOLICITE ATENCIN  MDICA SI:   La hinchazn vuelve o empeora.  Observa una secrecin en la zona de la puncin.  Observa una lnea roja que se extiende por arriba o por debajo del lugar en el que le insertaron la aguja. SOLICITE ATENCIN MDICA DE INMEDIATO SI:   Tiene fiebre.  Siente un dolor que empeora aun tomando antibiticos.  La zona est roja y caliente y tiene  problemas para Nurse, adult. ASEGRESE DE QUE:   Comprende estas instrucciones.  Controlar su afeccin.  Recibir ayuda de inmediato si no mejora o si empeora. Document Released: 07/04/2013 [facility] Patient Information 2015 [place]. This information is not intended to replace advice given to you by your health care provider. Make sure you discuss any questions you have with your health care provider.  Crioterapia  (Cryotherapy)  La crioterapia consiste en aplicar hielo en una lesin. El hielo ayuda a Therapist, occupational y la hinchazn despus de una lesin. Hace ms efecto cuando si se comienza a usar en las primeras 24 a 48 horas.  CUIDADOS EN EL HOGAR   Ponga una toalla seca o hmeda entre el hielo y la piel.  Puede presionar suavemente sobre el hielo.  Deje el hielo no ms de 10 a 20 minutos a una hora.  Revise la piel despus de 5 minutos para asegurarse de que est bien.  Descanse al menos 20 minutos entre las aplicaciones de hielo.  Suspenda el uso si la piel pierde la sensibilidad (adormecimiento).  No use hielo en alguien que no pueda decir cuando le duele. Aqu se incluye a los nios pequeos y a las personas con problemas de memoria (demencia). SOLICITE AYUDA DE INMEDIATO SI:   Tiene manchas blancas en la piel.  La piel est azul o plida.  Siente que la piel est dura o similar a la cera.  La hinchazn empeora. ASEGRESE DE QUE:   Comprende estas instrucciones.  Controlar su enfermedad.  Solicitar ayuda de inmediato si no mejora o si empeora. Document Released: 09/02/2011 Document Revised: 12/06/2011 [facility] Patient Information 2015 [place]. This information is not intended to replace advice given to you by your health care provider. Make sure you discuss any questions you have with your health care provider.  Terapia con calor  (Heat Therapy)  La terapia con calor puede ayudar a que las articulaciones y los msculos doloridos y  rgidos se Engineer, mining. No aplique calor en las lesiones nuevas. Espere por lo menos a que pasen 48 horas despus de una lesin para Presenter, broadcasting. No utilice calor cuando sienta molestias o dolores inmediatamente despus de [place]. Si an siente dolor 3 horas despus de finalizar la actividad, Chiropractor. CUIDADOS EN EL HOGAR  Compresas hmedas calientes   Remoje una toalla limpia en agua caliente. Extraiga el exceso de [place].  Ponga la toalla tibia y hmeda en una bolsa de plstico.  Coloque una toalla delgada y seca entre la piel y la bolsa.  Aplique la bolsa con calor en la zona durante 5 minutos y controle su piel. La piel puede estar de color rosa pero no debe estar roja.  Deje la compresa caliente sobre el rea durante 15 a 30 minutos.  Repita esto cada 2 a 4 horas mientras est despierto. No utilice calor mientras usted est durmiendo. Bao de agua caliente   Llene una baera con agua tibia.  Coloque la parte afectada del cuerpo en la baera.  Remoje la zona durante 20 a 40  minutos.  Repita siempre que lo necesite. Bolsa de agua caliente   [employer] la bolsa hasta la mitad con agua caliente.  Extraiga el exceso de [place]. Cierre la tapa de [place] que [place].  Colquese una toalla seca entre la piel y la [place].  Coloque la bolsa sobre la zona durante 5 minutos y controle su piel. La piel puede estar de color rosa pero no debe estar roja.  Deje la bolsa en el rea durante 15 a 30 minutos.  Repita esto cada 2 a 4 horas mientras est despierto. Almohadilla trmica   Colquese una toalla seca entre la piel y la almohadilla trmica.  Ajuste la almohadilla en temperatura baja.  Colquela sobre la zona durante 10 minutos y controle su piel. La piel puede estar de color rosa pero no roja.  Deje la almohadilla trmica en el rea durante 20 a 40 minutos.  Repita esto cada 2 a 4 horas mientras est despierto.  No se acueste Corporate investment banker.  No se duerma mientras la est usando.  No debe usarla cuando se encuentre cerca del agua. SOLICITE AYUDA DE INMEDIATO SI:   Tiene ampollas o la piel est roja.  La piel est inflamada (hinchada) o pierde la sensibilidad (adormecimiento) en la zona afectada.  Tiene algn problema nuevo.  Los sntomas empeoran.  Tiene preguntas o preocupaciones. Si tiene algn problema, deje de usar la terapia de calor hasta que consulte al mdico.  ASEGRESE DE QUE:   Comprende estas instrucciones.  Controlar su enfermedad.  Solicitar ayuda de inmediato si no mejora o si empeora. Document Released: 12/06/2011 [facility] Patient Information 2015 [place]. This information is not intended to replace advice given to you by your health care provider. Make sure you discuss any questions you have with your health care provider.

## 2014-06-04 NOTE — ED Provider Notes (Signed)
Medical screening examination/treatment/procedure(s) were performed by non-physician practitioner and as supervising physician I was immediately available for consultation/collaboration.   EKG Interpretation None        Orpah Greek, MD 06/04/14 [phone]

## 2014-06-07 LAB — BODY FLUID CULTURE: Culture: NO GROWTH

## 2014-07-26 ENCOUNTER — Encounter (HOSPITAL_COMMUNITY): Payer: Self-pay | Admitting: Emergency Medicine

## 2014-07-26 ENCOUNTER — Emergency Department (HOSPITAL_COMMUNITY): Payer: Self-pay

## 2014-07-26 ENCOUNTER — Emergency Department (HOSPITAL_COMMUNITY)
Admission: EM | Admit: 2014-07-26 | Discharge: 2014-07-26 | Disposition: A | Discharge status: Home / Self Care | Payer: Self-pay | Attending: Emergency Medicine | Admitting: Emergency Medicine

## 2014-07-26 DIAGNOSIS — M25461 Effusion, right knee: Secondary | ICD-10-CM | POA: Insufficient documentation

## 2014-07-26 DIAGNOSIS — M79609 Pain in unspecified limb: Secondary | ICD-10-CM

## 2014-07-26 DIAGNOSIS — Z8639 Personal history of other endocrine, nutritional and metabolic disease: Secondary | ICD-10-CM | POA: Insufficient documentation

## 2014-07-26 DIAGNOSIS — M25462 Effusion, left knee: Secondary | ICD-10-CM | POA: Insufficient documentation

## 2014-07-26 DIAGNOSIS — M7121 Synovial cyst of popliteal space [Baker], right knee: Secondary | ICD-10-CM | POA: Insufficient documentation

## 2014-07-26 DIAGNOSIS — M25569 Pain in unspecified knee: Secondary | ICD-10-CM

## 2014-07-26 DIAGNOSIS — Z791 Long term (current) use of non-steroidal anti-inflammatories (NSAID): Secondary | ICD-10-CM | POA: Insufficient documentation

## 2014-07-26 DIAGNOSIS — R509 Fever, unspecified: Secondary | ICD-10-CM | POA: Insufficient documentation

## 2014-07-26 DIAGNOSIS — M25561 Pain in right knee: Secondary | ICD-10-CM | POA: Insufficient documentation

## 2014-07-26 DIAGNOSIS — G8929 Other chronic pain: Secondary | ICD-10-CM | POA: Insufficient documentation

## 2014-07-26 DIAGNOSIS — Z79899 Other long term (current) drug therapy: Secondary | ICD-10-CM | POA: Insufficient documentation

## 2014-07-26 DIAGNOSIS — I10 Essential (primary) hypertension: Secondary | ICD-10-CM | POA: Insufficient documentation

## 2014-07-26 DIAGNOSIS — M1990 Unspecified osteoarthritis, unspecified site: Secondary | ICD-10-CM | POA: Insufficient documentation

## 2014-07-26 DIAGNOSIS — M25562 Pain in left knee: Secondary | ICD-10-CM | POA: Insufficient documentation

## 2014-07-26 LAB — CBC WITH DIFFERENTIAL/PLATELET
BASOS ABS: 0 10*3/uL (ref 0.0–0.1)
BASOS PCT: 0 % (ref 0–1)
Eosinophils Absolute: 0.1 10*3/uL (ref 0.0–0.7)
Eosinophils Relative: 1 % (ref 0–5)
HCT: 32.7 % — ABNORMAL LOW (ref 36.0–46.0)
Hemoglobin: 10.4 g/dL — ABNORMAL LOW (ref 12.0–15.0)
Lymphocytes Relative: 12 % (ref 12–46)
Lymphs Abs: 0.9 10*3/uL (ref 0.7–4.0)
MCH: 25.3 pg — ABNORMAL LOW (ref 26.0–34.0)
MCHC: 31.8 g/dL (ref 30.0–36.0)
MCV: 79.6 fL (ref 78.0–100.0)
MONO ABS: 0.3 10*3/uL (ref 0.1–1.0)
Monocytes Relative: 5 % (ref 3–12)
NEUTROS ABS: 6 10*3/uL (ref 1.7–7.7)
NEUTROS PCT: 82 % — AB (ref 43–77)
Platelets: 380 10*3/uL (ref 150–400)
RBC: 4.11 MIL/uL (ref 3.87–5.11)
RDW: 14.8 % (ref 11.5–15.5)
WBC: 7.3 10*3/uL (ref 4.0–10.5)

## 2014-07-26 LAB — URINALYSIS, ROUTINE W REFLEX MICROSCOPIC
BILIRUBIN URINE: NEGATIVE
Glucose, UA: NEGATIVE mg/dL
Hgb urine dipstick: NEGATIVE
Ketones, ur: NEGATIVE mg/dL
Leukocytes, UA: NEGATIVE
NITRITE: NEGATIVE
PROTEIN: NEGATIVE mg/dL
SPECIFIC GRAVITY, URINE: 1.009 (ref 1.005–1.030)
UROBILINOGEN UA: 0.2 mg/dL (ref 0.0–1.0)
pH: 6 (ref 5.0–8.0)

## 2014-07-26 LAB — BASIC METABOLIC PANEL
ANION GAP: 16 — AB (ref 5–15)
BUN: 9 mg/dL (ref 6–23)
CO2: 25 mEq/L (ref 19–32)
CREATININE: 0.54 mg/dL (ref 0.50–1.10)
Calcium: 9.4 mg/dL (ref 8.4–10.5)
Chloride: 98 mEq/L (ref 96–112)
GFR calc non Af Amer: >90 mL/min (ref >90)
Glucose, Bld: 153 mg/dL — ABNORMAL HIGH (ref 70–99)
POTASSIUM: 3.6 meq/L — AB (ref 3.7–5.3)
Sodium: 139 mEq/L (ref 137–147)

## 2014-07-26 NOTE — ED Provider Notes (Signed)
CSN: [id number]     Arrival date & time 07/26/14  1238 History   First MD Initiated Contact with Patient 07/26/14 1248     Chief Complaint  Patient presents with  . Knee Pain     (Consider location/radiation/quality/duration/timing/severity/associated sxs/prior Treatment) Patient is a 38 y.o. female presenting with leg pain. The history is provided by the patient. The history is limited by a language barrier. A language interpreter was used.  Leg Pain Location:  Knee Time since incident:  3 months Injury: no   Knee location:  R knee Pain details:    Quality:  Aching   Radiates to:  Does not radiate   Severity:  Moderate   Onset quality:  Gradual   Duration:  3 months   Timing:  Constant   Progression:  Worsening Chronicity:  Chronic Dislocation: no   Foreign body present:  No foreign bodies Relieved by:  Rest Worsened by:  Activity and bearing weight Ineffective treatments:  NSAIDs and rest Associated symptoms: fever (subjective) and swelling   Associated symptoms: no decreased ROM, no muscle weakness, no neck pain, no numbness, no stiffness and no tingling     Past Medical History  Diagnosis Date  . Arthritis   . Hypertension    Past Surgical History  Procedure Laterality Date  . Appendectomy     History reviewed. No pertinent family history. History  Substance Use Topics  . Smoking status: Never Smoker   . Smokeless tobacco: Not on file  . Alcohol Use: No   OB History   Grav Para Term Preterm Abortions TAB SAB Ect Mult Living                 Review of Systems  Constitutional: Positive for fever (subjective).  HENT: Negative for congestion, rhinorrhea and sore throat.   Respiratory: Negative for cough and shortness of breath.   Cardiovascular: Negative for chest pain.  Gastrointestinal: Negative for nausea, vomiting, abdominal pain and diarrhea.  Genitourinary: Negative for dysuria and hematuria.  Musculoskeletal: Positive for arthralgias (bilateral  knees) and joint swelling (right > left). Negative for neck pain and stiffness.  Skin: Negative for rash.  Neurological: Negative for syncope, light-headedness and headaches.  All other systems reviewed and are negative.     Allergies  Review of patient's allergies indicates no known allergies.  Home Medications   Prior to Admission medications   Medication Sig Start Date End Date Taking? Authorizing Provider  celecoxib (CELEBREX) 100 MG capsule Take 100 mg by mouth daily.    Historical Provider, MD  Cholecalciferol (VITAMIN D PO) Take 1 tablet by mouth daily.    Historical Provider, MD  diclofenac sodium (VOLTAREN) 1 % GEL Apply 4 g topically 4 (four) times daily. 06/04/14   Mercedes Strupp Camprubi-Soms, PA-C  oxyCODONE-acetaminophen (PERCOCET) 5-325 MG per tablet Take 1-2 tablets by mouth every 6 (six) hours as needed for severe pain. 06/04/14   Mercedes Strupp Camprubi-Soms, PA-C   BP 122/72  Pulse 103  Temp(Src) 98 F (36.7 C) (Oral)  Resp 18  SpO2 100% Physical Exam  Nursing note and vitals reviewed. Constitutional: She is oriented to person, place, and time. She appears well-developed and well-nourished.  HENT:  Head: Normocephalic and atraumatic.  Right Ear: External ear normal.  Left Ear: External ear normal.  Eyes: EOM are normal.  Neck: Normal range of motion. Neck supple.  Cardiovascular: Normal rate, regular rhythm and intact distal pulses.  Exam reveals no gallop and no friction rub.  No murmur heard. Pulmonary/Chest: Effort normal and breath sounds normal. No respiratory distress. She has no wheezes. She has no rales. She exhibits no tenderness.  Abdominal: Soft. Bowel sounds are normal. She exhibits no distension. There is no tenderness. There is no rebound.  Musculoskeletal: Normal range of motion. She exhibits edema (right>left) and tenderness.  Right Lower Extremity INSPECTION: Normal appearance. Mild swelling of right knee PALPATION: mild tenderness, mild  warmth ROM: normal ROM. VASCULAR: Extremity warm and well-perfused. 2+ dosalis pedis and posterior tibialis pulses. Capillary refill <2 seconds NEURO-SENSORY: Normal sensibility to light touch in DP/SP/sural/saphenous distributions. No numbness or paresthesias. No focal sensory deficit. NEURO-MOTOR: Intact EHL/FHL/TA/GS motor function. No focal motor deficit.  Left Lower Extremity INSPECTION: mild swelling, less than right. PALPATION: mild tenderness mild warmth ROM: normal ROM. VASCULAR: Extremity warm and well-perfused. 2+ dosalis pedis and posterior tibialis pulses. Capillary refill <2 seconds NEURO-SENSORY: Normal sensibility to light touch in DP/SP/sural/saphenous distributions. No numbness or paresthesias. No focal sensory deficit. NEURO-MOTOR: Intact EHL/FHL/TA/GS motor function. No focal motor deficit.  Lymphadenopathy:    She has no cervical adenopathy.  Neurological: She is alert and oriented to person, place, and time.  Skin: Skin is warm. No rash noted.  Psychiatric: She has a normal mood and affect. Her behavior is normal.    ED Course  Procedures (including critical care time) Labs Review Labs Reviewed  CBC WITH DIFFERENTIAL - Abnormal; Notable for the following:    Hemoglobin 10.4 (*)    HCT 32.7 (*)    MCH 25.3 (*)    Neutrophils Relative % 82 (*)    All other components within normal limits  BASIC METABOLIC PANEL - Abnormal; Notable for the following:    Potassium 3.6 (*)    Glucose, Bld 153 (*)    Anion gap 16 (*)    All other components within normal limits  URINALYSIS, ROUTINE W REFLEX MICROSCOPIC - Abnormal; Notable for the following:    APPearance CLOUDY (*)    All other components within normal limits    Imaging Review Dg Knee Complete 4 Views Right  07/26/2014   CLINICAL DATA:  Right knee pain for 3 months. Swelling. No known injury.  EXAM: RIGHT KNEE - COMPLETE 4+ VIEW  COMPARISON:  06/03/2014  FINDINGS: There is a small joint effusion. No acute  bony abnormality. Specifically, no fracture, subluxation, or dislocation. Soft tissues are intact.  IMPRESSION: Small joint effusion.  No acute bony abnormality.   Electronically Signed   By: Rolm Baptise M.D.   On: 07/26/2014 14:42     EKG Interpretation None      MDM   Final diagnoses:  Recurrent knee pain  Baker's cyst, right    12:49 PM Pt is a 38 y.o. female with pertinent PMHX of HTN, HLD who presents to the ED with bilateral knee swelling and pain with ambulation off and on for several years. Worse over the past few months. Intermittent subjective fevers. No nausea, vomiitng or diarrhea. No falls or injuries. Review of records with patient. Has had positive ANA and inflammtory markers, but has not been referred to rheumatology. Patient transferred by PCP for evaluation for septic arthritis. No recent illness  Seen in 05/2014 for similar  Plan for CBc, BMP and UA and plain film of the right knee for concern for systemic illness given elevated ESR/CRP  Low suspicion for septic arthritis. Patient's symptoms ongoing for 3-4 months and has normal range of motion of bilateral knees. Plan for screening labs,  UA and XR right knee  will also obtain right lower extremity doppler to rule out possible DVT given calf tenderness. No immobilization. No previous DVt or PE  XR right knee AP/LAT/OBL/ per my read showed no no acute fracture, small joint effusion. No evidence of bony erosion  Review of labs: CBC: no leukocytosis, H&H 10.4/32.7 BMP: showed hypokalemia UA: negative for UTI  [place] right lower extremity: ruptured baker's cyst. No DVT  Plan for discharge for ruptured baker's cyst. Patient to use scheduled NSAIDs, rheumatology referral and follow up with PCP. Strict return precautions given  3:39 PM:  I have discussed the diagnosis/risks/treatment options with the patient and believe the pt to be eligible for discharge home to follow-up with PCP, rheumatology follow up. We also  discussed returning to the ED immediately if new or worsening sx occur. We discussed the sx which are most concerning (e.g., worsening symptoms) that necessitate immediate return. Any new prescriptions provided to the patient are listed below.   New Prescriptions   No medications on file    The patient appears reasonably screened and/or stabilized for discharge and I doubt any other medical condition or other [facility] requiring further screening, evaluation or treatment in the ED at this time prior to discharge . Pt in agreement with discharge plan. Return precautions given. Pt discharged VSS  Labs and imaging reviewed by myself and considered in medical decision making if ordered.  Imaging interpreted by radiology. Pt was discussed with my attending, Dr. Arley Phenix, MD 07/26/14 1556

## 2014-07-26 NOTE — ED Notes (Signed)
Reports reports right knee and calf pain for extended amount of time. Pt was sent here to r/o septic arthritis. Reports intermittent fever and abnormal labs, elevated CRP and sed rate.

## 2014-07-26 NOTE — Discharge Instructions (Signed)
1. Motrin 600mg  three times daily for 2-3 days and then as needed every 6 hours 2. See PCP in 1 week 3. Come back if worsening symptoms 4. Call to get appointment with Rheumatology Quiste de Baker (Baker Cyst) El quiste de Child psychotherapist es una estructura similar a una bolsa, que se encuentra en la zona posterior de la rodilla. Est llena del mismo lquido contenido por la rodilla. El lquido [employer] y el cartlago de la rodilla y les permite desplazarse, uno sobre otro, ms fcilmente. CAUSAS  Cuando la rodilla se lesiona o inflama, se forma ms lquido en su interior. Cuando esto ocurre, el revestimiento de la articulacin es empujado hacia la parte posterior de la rodilla y forma el quiste de Child psychotherapist. Este quiste tambin puede ser provocado por inflamaciones derivadas de enfermedades artrticas e infecciones. SIGNOS Y SNTOMAS  El quiste de Child psychotherapist generalmente no presenta sntomas. Cuando el quiste se agranda considerablemente:  Puede sentir rigidez en la rodilla, presin o un bulto en la zona posterior.  Puede presentar dolor, enrojecimiento e hinchazn en la pantorrilla. Esto puede sugerir la formacin de un cogulo sanguneo y requiere Nutritional therapist de parte de su mdico. DIAGNSTICO  Un quiste de Child psychotherapist se detecta, con mayor frecuencia, durante una ecografa. Es posible que este examen se haya indicado por otras razones y que el quiste se detecte accidentalmente. En ocasiones, se utiliza Public house manager. Este estudio detecta otros problemas en una articulacin, que no pueden observarse en una ecografa. Si el quiste de Child psychotherapist se form inmediatamente despus de una lesin, se pueden Risk manager radiografas para su diagnstico. TRATAMIENTO  El tratamiento depende de la causa del [place]. A menudo se indicarn antiinflamatorios y reposo. Si el quiste es provocado por una infeccin bacteriana, pueden recetarse antibiticos.  INSTRUCCIONES PARA EL CUIDADO EN EL HOGAR   Si el quiste fue  causado por una lesin, mantenga elevada la pierna lesionada sobre 2 almohadas mientras est [place], durante las primeras 24 horas.  Aplique hielo a la zona lesionada mientras est despierto, durante las primeras 24 horas.  Ponga el hielo en una bolsa plstica.  Colquese una toalla entre la piel y la bolsa de hielo.  Deje el hielo durante 20 minutos, 2 a 3 veces por da.  Slo tome medicamentos de venta libre o recetados para Glass blower/designer, Health and safety inspector o bajar la fiebre, segn las indicaciones de su mdico.  [employer] antibiticos tal como se le indic. Finalice la prescripcin completa, aunque se sienta mejor. ASEGRESE DE QUE:   Comprende estas instrucciones.  Controlar su afeccin.  Recibir ayuda de inmediato si no mejora o si empeora. Document Released: 09/13/2005 Document Revised: 07/04/2013 [facility] Patient Information 2015 [place], [place]. This information is not intended to replace advice given to you by your health care provider. Make sure you discuss any questions you have with your health care provider.

## 2014-07-26 NOTE — ED Notes (Signed)
Patient transported to X-ray 

## 2014-07-26 NOTE — Progress Notes (Signed)
VASCULAR LAB PRELIMINARY  PRELIMINARY  PRELIMINARY  PRELIMINARY  Right lower extremity venous duplex completed.    Preliminary report:  Right:  No evidence of DVTor superficial thrombosis. There is an area of mixed echoes coursing from the popliteal fossa 9.1.cm into the calf consistent with a ruptures Baker's cyst.  Idamae Coccia, RVS 07/26/2014, 3:18 PM

## 2014-08-25 ENCOUNTER — Emergency Department (HOSPITAL_COMMUNITY)
Admission: EM | Admit: 2014-08-25 | Discharge: 2014-08-25 | Disposition: A | Discharge status: Home / Self Care | Payer: Self-pay | Attending: Emergency Medicine | Admitting: Emergency Medicine

## 2014-08-25 ENCOUNTER — Encounter (HOSPITAL_COMMUNITY): Payer: Self-pay | Admitting: Emergency Medicine

## 2014-08-25 ENCOUNTER — Emergency Department (HOSPITAL_COMMUNITY): Payer: Self-pay

## 2014-08-25 ENCOUNTER — Other Ambulatory Visit: Payer: Self-pay

## 2014-08-25 DIAGNOSIS — R079 Chest pain, unspecified: Secondary | ICD-10-CM

## 2014-08-25 DIAGNOSIS — R0789 Other chest pain: Secondary | ICD-10-CM | POA: Insufficient documentation

## 2014-08-25 DIAGNOSIS — Z79899 Other long term (current) drug therapy: Secondary | ICD-10-CM | POA: Insufficient documentation

## 2014-08-25 DIAGNOSIS — M1990 Unspecified osteoarthritis, unspecified site: Secondary | ICD-10-CM | POA: Insufficient documentation

## 2014-08-25 DIAGNOSIS — M25561 Pain in right knee: Secondary | ICD-10-CM | POA: Insufficient documentation

## 2014-08-25 DIAGNOSIS — Z791 Long term (current) use of non-steroidal anti-inflammatories (NSAID): Secondary | ICD-10-CM | POA: Insufficient documentation

## 2014-08-25 DIAGNOSIS — I10 Essential (primary) hypertension: Secondary | ICD-10-CM | POA: Insufficient documentation

## 2014-08-25 DIAGNOSIS — R0602 Shortness of breath: Secondary | ICD-10-CM | POA: Insufficient documentation

## 2014-08-25 DIAGNOSIS — Z3202 Encounter for pregnancy test, result negative: Secondary | ICD-10-CM | POA: Insufficient documentation

## 2014-08-25 LAB — CBC
HCT: 34.4 % — ABNORMAL LOW (ref 36.0–46.0)
Hemoglobin: 10.7 g/dL — ABNORMAL LOW (ref 12.0–15.0)
MCH: 25.1 pg — AB (ref 26.0–34.0)
MCHC: 31.1 g/dL (ref 30.0–36.0)
MCV: 80.8 fL (ref 78.0–100.0)
PLATELETS: 495 10*3/uL — AB (ref 150–400)
RBC: 4.26 MIL/uL (ref 3.87–5.11)
RDW: 16.4 % — AB (ref 11.5–15.5)
WBC: 11.4 10*3/uL — ABNORMAL HIGH (ref 4.0–10.5)

## 2014-08-25 LAB — BASIC METABOLIC PANEL
ANION GAP: 19 — AB (ref 5–15)
BUN: 10 mg/dL (ref 6–23)
CALCIUM: 9.3 mg/dL (ref 8.4–10.5)
CO2: 23 meq/L (ref 19–32)
Chloride: 101 mEq/L (ref 96–112)
Creatinine, Ser: 0.6 mg/dL (ref 0.50–1.10)
Glucose, Bld: 102 mg/dL — ABNORMAL HIGH (ref 70–99)
Potassium: 3.9 mEq/L (ref 3.7–5.3)
SODIUM: 143 meq/L (ref 137–147)

## 2014-08-25 LAB — PRO B NATRIURETIC PEPTIDE: PRO B NATRI PEPTIDE: 238.4 pg/mL — AB (ref 0–125)

## 2014-08-25 LAB — I-STAT TROPONIN, ED: Troponin i, poc: 0 ng/mL (ref 0.00–0.08)

## 2014-08-25 LAB — POC URINE PREG, ED
Preg Test, Ur: NEGATIVE
Preg Test, Ur: NEGATIVE

## 2014-08-25 MED ORDER — OXYCODONE HCL 5 MG PO TABS: 5.0000 mg | ORAL_TABLET | Freq: Four times a day (QID) | ORAL | Status: DC | PRN

## 2014-08-25 MED ORDER — SODIUM CHLORIDE 0.9 % IV BOLUS (SEPSIS)
1000.0000 mL | Freq: Once | INTRAVENOUS | Status: AC
  Administered 2014-08-25: 1000 mL via INTRAVENOUS

## 2014-08-25 MED ORDER — IOHEXOL 350 MG/ML SOLN
100.0000 mL | Freq: Once | INTRAVENOUS | Status: AC | PRN
  Administered 2014-08-25: 100 mL via INTRAVENOUS

## 2014-08-25 MED ORDER — HYDROMORPHONE HCL 1 MG/ML IJ SOLN
1.0000 mg | Freq: Once | INTRAMUSCULAR | Status: AC
Start: 2014-08-25 — End: 2014-08-25
  Administered 2014-08-25: 1 mg via INTRAVENOUS
  Filled 2014-08-25: qty 1

## 2014-08-25 NOTE — ED Notes (Signed)
EKG given to Wiseman for review

## 2014-08-25 NOTE — ED Provider Notes (Signed)
CSN: [id number]     Arrival date & time 08/25/14  0258 History   First MD Initiated Contact with Patient 08/25/14 0330     Chief Complaint  Patient presents with  . Chest Pain     (Consider location/radiation/quality/duration/timing/severity/associated sxs/prior Treatment) Patient is a 38 y.o. female presenting with chest pain. The history is provided by the patient.  Chest Pain Pain location:  Substernal area Pain quality: sharp   Pain radiates to:  Upper back Pain radiates to the back: yes   Pain severity:  Moderate Onset quality:  Gradual Duration:  3 days Timing:  Constant Progression:  Worsening Chronicity:  New Context: breathing and at rest   Relieved by:  Nothing Worsened by:  Nothing tried Ineffective treatments:  None tried Associated symptoms: shortness of breath   Associated symptoms: no abdominal pain, no back pain, no cough, no dizziness, no fatigue, no fever, no headache, no nausea and not vomiting     Past Medical History  Diagnosis Date  . Arthritis   . Hypertension    Past Surgical History  Procedure Laterality Date  . Appendectomy     History reviewed. No pertinent family history. History  Substance Use Topics  . Smoking status: Never Smoker   . Smokeless tobacco: Not on file  . Alcohol Use: No   OB History    No data available     Review of Systems  Constitutional: Negative for fever and fatigue.  HENT: Negative for congestion and drooling.   Eyes: Negative for pain.  Respiratory: Positive for shortness of breath. Negative for cough.   Cardiovascular: Positive for chest pain.  Gastrointestinal: Negative for nausea, vomiting, abdominal pain and diarrhea.  Genitourinary: Negative for dysuria and hematuria.  Musculoskeletal: Negative for back pain, gait problem and neck pain.  Skin: Negative for color change.  Neurological: Negative for dizziness and headaches.  Hematological: Negative for adenopathy.  Psychiatric/Behavioral: Negative  for behavioral problems.  All other systems reviewed and are negative.     Allergies  Review of patient's allergies indicates no known allergies.  Home Medications   Prior to Admission medications   Medication Sig Start Date End Date Taking? Authorizing Provider  celecoxib (CELEBREX) 100 MG capsule Take 100 mg by mouth daily.    Historical Provider, MD  Cholecalciferol (VITAMIN D PO) Take 1 tablet by mouth daily.    Historical Provider, MD  Hydrocodone-Acetaminophen 5-300 MG TABS Take 1 tablet by mouth every 6 (six) hours as needed (pain).    Historical Provider, MD   BP 122/81 mmHg  Pulse 124  Temp(Src) 98.3 F (36.8 C) (Oral)  Resp 18  SpO2 100%  LMP 07/22/2014 Physical Exam  Constitutional: She is oriented to person, place, and time. She appears well-developed and well-nourished.  HENT:  Head: Normocephalic.  Mouth/Throat: Oropharynx is clear and moist. No oropharyngeal exudate.  Eyes: Conjunctivae and EOM are normal. Pupils are equal, round, and reactive to light.  Neck: Normal range of motion. Neck supple.  Cardiovascular: Regular rhythm, normal heart sounds and intact distal pulses.  Exam reveals no gallop and no friction rub.   No murmur heard. HR 105  Pulmonary/Chest: Effort normal and breath sounds normal. No respiratory distress. She has no wheezes.  Abdominal: Soft. Bowel sounds are normal. There is no tenderness. There is no rebound and no guarding.  Musculoskeletal: Normal range of motion. She exhibits tenderness (mild tenderness to palpation with firm small mass noted in the right popliteal fossa. Otherwise normal-appearing right knee with  normal range of motion.). She exhibits no edema.  Neurological: She is alert and oriented to person, place, and time.  Skin: Skin is warm and dry.  Psychiatric: She has a normal mood and affect. Her behavior is normal.  Nursing note and vitals reviewed.   ED Course  Procedures (including critical care time) Labs  Review Labs Reviewed  CBC - Abnormal; Notable for the following:    WBC 11.4 (*)    Hemoglobin 10.7 (*)    HCT 34.4 (*)    MCH 25.1 (*)    RDW 16.4 (*)    Platelets 495 (*)    All other components within normal limits  BASIC METABOLIC PANEL - Abnormal; Notable for the following:    Glucose, Bld 102 (*)    Anion gap 19 (*)    All other components within normal limits  PRO B NATRIURETIC PEPTIDE - Abnormal; Notable for the following:    Pro B Natriuretic peptide (BNP) 238.4 (*)    All other components within normal limits  I-STAT TROPOININ, ED  POC URINE PREG, ED  POC URINE PREG, ED    Imaging Review Ct Angio Chest Pe W/cm &/or Wo Cm  08/25/2014   CLINICAL DATA:  Acute onset of shortness of breath and sharp mid back pain. Mild leukocytosis. Initial encounter.  EXAM: CT ANGIOGRAPHY CHEST WITH CONTRAST  TECHNIQUE: Multidetector CT imaging of the chest was performed using the standard protocol during bolus administration of intravenous contrast. Multiplanar CT image reconstructions and MIPs were obtained to evaluate the vascular anatomy.  CONTRAST:  123mL OMNIPAQUE IOHEXOL 350 MG/ML SOLN  COMPARISON:  None.  FINDINGS: There is no evidence of pulmonary embolus.  The lungs appear clear bilaterally. There is no evidence of significant focal consolidation, pleural effusion or pneumothorax. No masses are identified; no abnormal focal contrast enhancement is seen.  The mediastinum is unremarkable in appearance. No mediastinal lymphadenopathy is seen. No pericardial effusion is identified. The great vessels are grossly unremarkable in appearance. No axillary lymphadenopathy is seen. The visualized portions of the thyroid gland are unremarkable in appearance.  The visualized portions of the liver and spleen are unremarkable.  No acute osseous abnormalities are seen.  Review of the MIP images confirms the above findings.  IMPRESSION: 1. No evidence of pulmonary embolus. 2. Lungs clear bilaterally.    Electronically Signed   By: Garald Balding M.D.   On: 08/25/2014 06:04     EKG Interpretation   Date/Time:  Sunday August 25 2014 03:19:05 EST Ventricular Rate:  109 PR Interval:  140 QRS Duration: 87 QT Interval:  338 QTC Calculation: 455 R Axis:   54 Text Interpretation:  Sinus tachycardia Baseline wander in lead(s) II III  aVR aVF no previous for comparison Confirmed by Ethyn Schetter  MD, Tayah Idrovo  ([id number]) on 08/25/2014 3:43:27 AM      MDM   Final diagnoses:  Other chest pain  Right knee pain    3:52 AM 38 y.o. female w hx of HTN, ruptured bakers cyst behind right knee who presents with sharp central chest pain radiating to her back which developed over the last few days and acutely worsened this morning while at rest. She currently rates her pain 8 out of 10. She does state that it is worse with breathing. She is also short of breath. She is tachycardic and vital signs are otherwise unremarkable. Will get pain control, screening labs, and CTA to rule out PE.   6:25 AM: I interpreted/reviewed the labs and/or  imaging which were non-contributory.  Pt feeling much better. Low risk for MACE per HEART score w/ atypical constant cp for several days. The family requested a sports medicine referral due to her ongoing right knee pain which is likely associated with a ruptured Baker's cyst which was previously seen on ultrasound.  I have discussed the diagnosis/risks/treatment options with the patient and family and believe the pt to be eligible for discharge home to follow-up with Ortho as needed and her pcp for ongoing cp. We also discussed returning to the ED immediately if new or worsening sx occur. We discussed the sx which are most concerning (e.g., worsening cp, sob) that necessitate immediate return. Medications administered to the patient during their visit and any new prescriptions provided to the patient are listed below.  Medications given during this visit Medications  sodium  chloride 0.9 % bolus 1,000 mL (1,000 mLs Intravenous New Bag/Given 08/25/14 0445)  HYDROmorphone (DILAUDID) injection 1 mg (1 mg Intravenous Given 08/25/14 0445)  iohexol (OMNIPAQUE) 350 MG/ML injection 100 mL (100 mLs Intravenous Contrast Given 08/25/14 0544)    New Prescriptions   OXYCODONE (ROXICODONE) 5 MG IMMEDIATE RELEASE TABLET    Take 1 tablet (5 mg total) by mouth every 6 (six) hours as needed for moderate pain or severe pain.     Pamella Pert, MD 08/25/14 [phone]

## 2014-08-25 NOTE — ED Notes (Signed)
Pt arrived to the Ed with chest pain.  Pt states that the pain has been present for two days.  Pt states that he pain feels like a stabbing sensation in the middle back with associated pain in the back.  Pt was seen for knee pain a week ago and given antibiotic and naprosyn for pain.  Pt states chest pain woke her up from sleep and she has had shortness of breath as well.

## 2014-08-26 ENCOUNTER — Telehealth: Payer: Self-pay | Admitting: Oncology

## 2014-08-26 NOTE — Telephone Encounter (Signed)
S/W GLORIA @ [facility] AND GAVE NP APPT FOR 12/15 @ 1:30 W/DR. SHADAD. REFERRING DR. Herbert Moors DX- ELEVATED PLT CT

## 2014-09-06 ENCOUNTER — Other Ambulatory Visit: Payer: Self-pay | Admitting: Oncology

## 2014-09-06 DIAGNOSIS — D473 Essential (hemorrhagic) thrombocythemia: Secondary | ICD-10-CM

## 2014-09-06 DIAGNOSIS — D75839 Thrombocytosis, unspecified: Secondary | ICD-10-CM

## 2014-09-10 ENCOUNTER — Encounter: Payer: Self-pay | Admitting: Oncology

## 2014-09-10 ENCOUNTER — Other Ambulatory Visit (HOSPITAL_BASED_OUTPATIENT_CLINIC_OR_DEPARTMENT_OTHER): Payer: Self-pay

## 2014-09-10 ENCOUNTER — Telehealth: Payer: Self-pay | Admitting: Oncology

## 2014-09-10 ENCOUNTER — Ambulatory Visit: Payer: Self-pay

## 2014-09-10 ENCOUNTER — Ambulatory Visit (HOSPITAL_BASED_OUTPATIENT_CLINIC_OR_DEPARTMENT_OTHER): Payer: Self-pay | Admitting: Oncology

## 2014-09-10 VITALS — BP 145/89 | HR 90 | Temp 97.4°F | Resp 18 | Ht 62.0 in | Wt 156.7 lb

## 2014-09-10 DIAGNOSIS — D509 Iron deficiency anemia, unspecified: Secondary | ICD-10-CM

## 2014-09-10 DIAGNOSIS — D72829 Elevated white blood cell count, unspecified: Secondary | ICD-10-CM

## 2014-09-10 DIAGNOSIS — D75839 Thrombocytosis, unspecified: Secondary | ICD-10-CM

## 2014-09-10 DIAGNOSIS — D473 Essential (hemorrhagic) thrombocythemia: Secondary | ICD-10-CM

## 2014-09-10 LAB — CBC WITH DIFFERENTIAL/PLATELET
BASO%: 0.1 % (ref 0.0–2.0)
Basophils Absolute: 0 10*3/uL (ref 0.0–0.1)
EOS ABS: 0.1 10*3/uL (ref 0.0–0.5)
EOS%: 0.4 % (ref 0.0–7.0)
HCT: 36.2 % (ref 34.8–46.6)
HGB: 11.1 g/dL — ABNORMAL LOW (ref 11.6–15.9)
LYMPH%: 15.2 % (ref 14.0–49.7)
MCH: 24.7 pg — ABNORMAL LOW (ref 25.1–34.0)
MCHC: 30.7 g/dL — AB (ref 31.5–36.0)
MCV: 80.6 fL (ref 79.5–101.0)
MONO#: 1.1 10*3/uL — AB (ref 0.1–0.9)
MONO%: 5 % (ref 0.0–14.0)
NEUT%: 79.3 % — ABNORMAL HIGH (ref 38.4–76.8)
NEUTROS ABS: 17.5 10*3/uL — AB (ref 1.5–6.5)
NRBC: 0 % (ref 0–0)
PLATELETS: 756 10*3/uL — AB (ref 145–400)
RBC: 4.49 10*6/uL (ref 3.70–5.45)
RDW: 17.3 % — ABNORMAL HIGH (ref 11.2–14.5)
WBC: 22 10*3/uL — ABNORMAL HIGH (ref 3.9–10.3)
lymph#: 3.4 10*3/uL — ABNORMAL HIGH (ref 0.9–3.3)

## 2014-09-10 LAB — COMPREHENSIVE METABOLIC PANEL (CC13)
ALT: 19 U/L (ref 0–55)
ANION GAP: 11 meq/L (ref 3–11)
AST: 11 U/L (ref 5–34)
Albumin: 2.6 g/dL — ABNORMAL LOW (ref 3.5–5.0)
Alkaline Phosphatase: 119 U/L (ref 40–150)
BUN: 11.8 mg/dL (ref 7.0–26.0)
CALCIUM: 9.2 mg/dL (ref 8.4–10.4)
CHLORIDE: 102 meq/L (ref 98–109)
CO2: 28 meq/L (ref 22–29)
CREATININE: 0.7 mg/dL (ref 0.6–1.1)
Glucose: 89 mg/dl (ref 70–140)
Potassium: 3.1 mEq/L — ABNORMAL LOW (ref 3.5–5.1)
Sodium: 141 mEq/L (ref 136–145)
Total Bilirubin: 0.22 mg/dL (ref 0.20–1.20)
Total Protein: 6.9 g/dL (ref 6.4–8.3)

## 2014-09-10 LAB — FERRITIN CHCC: FERRITIN: 8 ng/mL — AB (ref 9–269)

## 2014-09-10 LAB — IRON AND TIBC CHCC
%SAT: 16 % — ABNORMAL LOW (ref 21–57)
IRON: 46 ug/dL (ref 41–142)
TIBC: 288 ug/dL (ref 236–444)
UIBC: 242 ug/dL (ref 120–384)

## 2014-09-10 LAB — TECHNOLOGIST REVIEW

## 2014-09-10 LAB — CHCC SMEAR

## 2014-09-10 NOTE — Progress Notes (Signed)
Please see consult note.  

## 2014-09-10 NOTE — Telephone Encounter (Signed)
Gave avs & cal for April 2016. °

## 2014-09-10 NOTE — Progress Notes (Signed)
Checked in new patient with no issues prior to seeing the dr. She has appt card and has not been traveling.  °

## 2014-09-10 NOTE — Consult Note (Signed)
Reason for Referral: Thrombocytosis.   HPI: 38 year old woman native of [place], currently living in [place] for 10 years. She has a past medical history significant for hypertension and arthritis. It is unclear the nature of for arthritis whether it's inflammatory or osteoarthritis. Most recently she was noticed to have diffuse swelling in her knees bilaterally and was unable to walk. She was evaluated by her prior care physician and noted to have elevated platelet count of 883 with a hemoglobin of 10.2 and MCV 78.9. Her white cell count was 12.3. A repeat CBC on 1123 showed a white cell count of 12.5, hemoglobin of 10.4 MCV of 77 and a platelet count of 679. She was started on iron supplement for a presumed iron deficiency anemia. She was also started on prednisone and Celebrex for her arthritis. She noticed Improvement in her symptoms on these medications. She does not report any other bleeding at this time  other than menstrual bleeding. She does not report any hematochezia or melena. She does not report any easy bruisability or petechiae. The only thing that is really bothering her is swelling in her knees. She does not report any headaches or blurry vision or syncope. Does not report any fevers or chills or sweats. Does not report any weight loss or constitutional symptoms. She does not report any nausea, vomiting, abdominal pain or hematochezia. She does not report any frequency urgency or hesitancy. She does not report any other joint involvement other than her knees. Rest of her review of systems unremarkable.   Past Medical History  Diagnosis Date  . Arthritis   . Hypertension   :  Past Surgical History  Procedure Laterality Date  . Appendectomy    :  Current Outpatient Prescriptions  Medication Sig Dispense Refill  . celecoxib (CELEBREX) 200 MG capsule Take 200 mg by mouth 2 (two) times daily.    . Cholecalciferol (VITAMIN D PO) Take 1 tablet by mouth daily.    . hydrochlorothiazide  (HYDRODIURIL) 25 MG tablet Take 25 mg by mouth daily.    . predniSONE (DELTASONE) 10 MG tablet Take 10 mg by mouth daily with breakfast.    . sulfamethoxazole-trimethoprim (BACTRIM,SEPTRA) 200-40 MG/5ML suspension Take by mouth 2 (two) times daily.     No current facility-administered medications for this visit.       No Known Allergies:  No family history on file.:  History   Social History  . Marital Status: Married    Spouse Name: N/A    Number of Children: N/A  . Years of Education: N/A   Occupational History  . Not on file.   Social History Main Topics  . Smoking status: Never Smoker   . Smokeless tobacco: Not on file  . Alcohol Use: No  . Drug Use: Not on file  . Sexual Activity: Not on file   Other Topics Concern  . Not on file   Social History Narrative  :  Pertinent items are noted in HPI.  Exam: Blood pressure 145/89, pulse 90, temperature 97.4 F (36.3 C), temperature source Oral, resp. rate 18, height 5\' 2"  (1.575 m), weight 156 lb 11.2 oz (71.079 kg), last menstrual period 07/22/2014, SpO2 100 %. General appearance: alert and cooperative Head: Normocephalic, without obvious abnormality Throat: lips, mucosa, and tongue normal; teeth and gums normal Neck: no adenopathy Back: negative Resp: clear to auscultation bilaterally Chest wall: no tenderness Cardio: regular rate and rhythm, S1, S2 normal, no murmur, click, rub or gallop GI: soft, non-tender; bowel sounds  normal; no masses,  no organomegaly Extremities: Bilateral knee swelling noted. Pulses: 2+ and symmetric Skin: Skin color, texture, turgor normal. No rashes or lesions Lymph nodes: Cervical, supraclavicular, and axillary nodes normal.   Recent Labs  09/10/14 1355  WBC 22.0*  HGB 11.1*  HCT 36.2  PLT 756*   *  Ct Angio Chest Pe W/cm &/or Wo Cm  08/25/2014   CLINICAL DATA:  Acute onset of shortness of breath and sharp mid back pain. Mild leukocytosis. Initial encounter.  EXAM: CT  ANGIOGRAPHY CHEST WITH CONTRAST  TECHNIQUE: Multidetector CT imaging of the chest was performed using the standard protocol during bolus administration of intravenous contrast. Multiplanar CT image reconstructions and MIPs were obtained to evaluate the vascular anatomy.  CONTRAST:  123mL OMNIPAQUE IOHEXOL 350 MG/ML SOLN  COMPARISON:  None.  FINDINGS: There is no evidence of pulmonary embolus.  The lungs appear clear bilaterally. There is no evidence of significant focal consolidation, pleural effusion or pneumothorax. No masses are identified; no abnormal focal contrast enhancement is seen.  The mediastinum is unremarkable in appearance. No mediastinal lymphadenopathy is seen. No pericardial effusion is identified. The great vessels are grossly unremarkable in appearance. No axillary lymphadenopathy is seen. The visualized portions of the thyroid gland are unremarkable in appearance.  The visualized portions of the liver and spleen are unremarkable.  No acute osseous abnormalities are seen.  Review of the MIP images confirms the above findings.  IMPRESSION: 1. No evidence of pulmonary embolus. 2. Lungs clear bilaterally.   Electronically Signed   By: Garald Balding M.D.   On: 08/25/2014 06:04    Assessment and Plan:   38 year old woman with the following issues:  1. Thrombocytosis: Differential diagnosis was discussed today with the patient and her family. Most likely this represents a reactive finding. Iron deficiency anemia is the most likely etiology at this point. Chronic inflammatory condition such as autoimmune arthritis could certainly contribute. Primary hematological disorder is extremely unlikely. Condition such as myeloproliferative disorder such as CML, essential thrombocythemia are also on the differential. From a management standpoint, I agree with treating the underlying condition and observation and surveillance for the time being.  2. Iron deficiency anemia: She is currently on iron  supplements recently started. I will recheck her iron stores in 4 months and consider IV iron if she has no good response.  3. Leukocytosis: Likely reactive related to inflammatory arthritis as well as prednisone. I doubt this is a myeloproliferative disorder but certainly we will continue to monitor this. If her repeat white cell count and few months continues to go higher, we will check her for CML with BCR ABL and possible JAK-2 mutation.   4. Follow-up: Will be in 4 months to recheck her counts.

## 2015-01-07 ENCOUNTER — Telehealth: Payer: Self-pay | Admitting: Oncology

## 2015-01-07 ENCOUNTER — Ambulatory Visit (HOSPITAL_BASED_OUTPATIENT_CLINIC_OR_DEPARTMENT_OTHER): Payer: Self-pay | Admitting: Oncology

## 2015-01-07 ENCOUNTER — Other Ambulatory Visit (HOSPITAL_BASED_OUTPATIENT_CLINIC_OR_DEPARTMENT_OTHER): Payer: Self-pay

## 2015-01-07 VITALS — BP 151/93 | HR 110 | Temp 98.0°F | Resp 18 | Ht 62.0 in | Wt 177.5 lb

## 2015-01-07 DIAGNOSIS — D72829 Elevated white blood cell count, unspecified: Secondary | ICD-10-CM

## 2015-01-07 DIAGNOSIS — D473 Essential (hemorrhagic) thrombocythemia: Secondary | ICD-10-CM

## 2015-01-07 DIAGNOSIS — M069 Rheumatoid arthritis, unspecified: Secondary | ICD-10-CM

## 2015-01-07 DIAGNOSIS — D509 Iron deficiency anemia, unspecified: Secondary | ICD-10-CM

## 2015-01-07 DIAGNOSIS — D500 Iron deficiency anemia secondary to blood loss (chronic): Secondary | ICD-10-CM

## 2015-01-07 LAB — CBC WITH DIFFERENTIAL/PLATELET
BASO%: 0.1 % (ref 0.0–2.0)
Basophils Absolute: 0 10*3/uL (ref 0.0–0.1)
EOS%: 0.4 % (ref 0.0–7.0)
Eosinophils Absolute: 0.1 10*3/uL (ref 0.0–0.5)
HEMATOCRIT: 38 % (ref 34.8–46.6)
HGB: 11.6 g/dL (ref 11.6–15.9)
LYMPH#: 2.2 10*3/uL (ref 0.9–3.3)
LYMPH%: 15.8 % (ref 14.0–49.7)
MCH: 25.8 pg (ref 25.1–34.0)
MCHC: 30.5 g/dL — AB (ref 31.5–36.0)
MCV: 84.4 fL (ref 79.5–101.0)
MONO#: 0.6 10*3/uL (ref 0.1–0.9)
MONO%: 4.4 % (ref 0.0–14.0)
NEUT%: 79.3 % — ABNORMAL HIGH (ref 38.4–76.8)
NEUTROS ABS: 10.9 10*3/uL — AB (ref 1.5–6.5)
Platelets: 611 10*3/uL — ABNORMAL HIGH (ref 145–400)
RBC: 4.5 10*6/uL (ref 3.70–5.45)
RDW: 16.5 % — ABNORMAL HIGH (ref 11.2–14.5)
WBC: 13.8 10*3/uL — AB (ref 3.9–10.3)

## 2015-01-07 NOTE — Telephone Encounter (Signed)
Pt confirmed labs/ov per 04/12 POF, gave pt AVS and Calendar.... KJ  °

## 2015-01-07 NOTE — Progress Notes (Signed)
Hematology and Oncology Follow Up Visit  Kayla Patton [id number] May 23, 1976 39 y.o. 01/07/2015 3:10 PM EDWARDS, MICHELLE P, NPEdwards, [name], NP   Principle Diagnosis: 39 year old woman with iron deficiency anemia and thrombocytosis diagnosed in December 2015. She also has rheumatoid arthritis.   Current therapy: Oral iron supplements.  Interim History:  The patient presents today for a follow-up visit. Since her last visit, she has been evaluated by rheumatology and was started on methotrexate which she has tolerated very well. She has reported her arthritis symptoms have improved. She also has started on prednisone and currently at 10 mg daily. She does not report any bleeding at this time including hematochezia or melena. She does not report any GYN bleeding. Her menstrual cycles have actually slow down dramatically.She does not report any hematochezia or melena. She does not report any easy bruisability or petechiae.  She does not report any headaches or blurry vision or syncope. Does not report any fevers or chills or sweats. Does not report any weight loss or constitutional symptoms. She does not report any nausea, vomiting, abdominal pain or hematochezia. She does not report any frequency urgency or hesitancy. She does not report any other joint involvement other than her knees. Rest of her review of systems unremarkable.   Medications: I have reviewed the patient's current medications.  Current Outpatient Prescriptions  Medication Sig Dispense Refill  . Cholecalciferol (VITAMIN D PO) Take 1 tablet by mouth daily.    . folic acid (FOLVITE) 1 MG tablet Take 1 mg by mouth.    . hydrochlorothiazide (HYDRODIURIL) 25 MG tablet Take 25 mg by mouth daily.    . methotrexate (RHEUMATREX) 2.5 MG tablet Take 20 mg by mouth once a week.    . predniSONE (DELTASONE) 10 MG tablet Take 10 mg by mouth daily with breakfast.     No current facility-administered medications for this visit.      Allergies: No Known Allergies  Past Medical History, Surgical history, Social history, and Family History were reviewed and updated.  Physical Exam: Blood pressure 151/93, pulse 110, temperature 98 F (36.7 C), temperature source Oral, resp. rate 18, height 5\' 2"  (1.575 m), weight 177 lb 8 oz (80.513 kg), SpO2 100 %. ECOG: 0 General appearance: alert and cooperative Head: Normocephalic, without obvious abnormality Neck: no adenopathy Lymph nodes: Cervical, supraclavicular, and axillary nodes normal. Heart:regular rate and rhythm, S1, S2 normal, no murmur, click, rub or gallop Lung:chest clear, no wheezing, rales, normal symmetric air entry Abdomin: soft, non-tender, without masses or organomegaly EXT:no erythema, induration, or nodules   Lab Results: Lab Results  Component Value Date   WBC 13.8* 01/07/2015   HGB 11.6 01/07/2015   HCT 38.0 01/07/2015   MCV 84.4 01/07/2015   PLT 611* 01/07/2015     Chemistry      Component Value Date/Time   NA 141 09/10/2014 1355   NA 143 08/25/2014 0426   K 3.1* 09/10/2014 1355   K 3.9 08/25/2014 0426   CL 101 08/25/2014 0426   CO2 28 09/10/2014 1355   CO2 23 08/25/2014 0426   BUN 11.8 09/10/2014 1355   BUN 10 08/25/2014 0426   CREATININE 0.7 09/10/2014 1355   CREATININE 0.60 08/25/2014 0426      Component Value Date/Time   CALCIUM 9.2 09/10/2014 1355   CALCIUM 9.3 08/25/2014 0426   ALKPHOS 119 09/10/2014 1355   ALKPHOS 67 09/30/2009 2143   AST 11 09/10/2014 1355   AST 16 09/30/2009 2143   ALT  19 09/10/2014 1355   ALT 14 09/30/2009 2143   BILITOT 0.22 09/10/2014 1355   BILITOT 0.4 09/30/2009 2143        Impression and Plan:  39 year old woman with the following issues:  1. Iron deficiency anemia: She has been on oral iron supplements which have corrected her hemoglobin. Prior studies are currently pending from today. I have recommended continued oral iron for the time being.  2. Thrombocytosis: Likely reactive in  nature seems to be improving as we correct her iron.  3. Leukocytosis: Reactive in nature likely related to steroids and rheumatoid arthritis seems to be improving as well.  4. Follow-up: Will be in 6 months to check on her clinical status and repeat her iron studies.   [name], MD 4/12/20163:10 PM

## 2015-07-08 ENCOUNTER — Ambulatory Visit: Payer: Self-pay | Admitting: Oncology

## 2015-07-08 ENCOUNTER — Other Ambulatory Visit: Payer: Self-pay

## 2015-07-22 ENCOUNTER — Telehealth: Payer: Self-pay | Admitting: Oncology

## 2015-07-22 NOTE — Telephone Encounter (Signed)
pt came by to r/s appt-gave 1st available-adv pt if pt needed to come b4 then to call triage nurse

## 2015-09-02 ENCOUNTER — Other Ambulatory Visit (HOSPITAL_BASED_OUTPATIENT_CLINIC_OR_DEPARTMENT_OTHER): Payer: Self-pay

## 2015-09-02 ENCOUNTER — Ambulatory Visit (HOSPITAL_BASED_OUTPATIENT_CLINIC_OR_DEPARTMENT_OTHER): Payer: Self-pay | Admitting: Oncology

## 2015-09-02 ENCOUNTER — Telehealth: Payer: Self-pay | Admitting: Oncology

## 2015-09-02 VITALS — BP 119/66 | HR 135 | Temp 98.2°F | Resp 18 | Ht 62.0 in | Wt 187.7 lb

## 2015-09-02 DIAGNOSIS — D509 Iron deficiency anemia, unspecified: Secondary | ICD-10-CM | POA: Insufficient documentation

## 2015-09-02 DIAGNOSIS — D72829 Elevated white blood cell count, unspecified: Secondary | ICD-10-CM

## 2015-09-02 DIAGNOSIS — D473 Essential (hemorrhagic) thrombocythemia: Secondary | ICD-10-CM

## 2015-09-02 DIAGNOSIS — D500 Iron deficiency anemia secondary to blood loss (chronic): Secondary | ICD-10-CM

## 2015-09-02 DIAGNOSIS — D508 Other iron deficiency anemias: Secondary | ICD-10-CM

## 2015-09-02 DIAGNOSIS — M069 Rheumatoid arthritis, unspecified: Secondary | ICD-10-CM

## 2015-09-02 LAB — CBC WITH DIFFERENTIAL/PLATELET
BASO%: 0.1 % (ref 0.0–2.0)
BASOS ABS: 0 10*3/uL (ref 0.0–0.1)
EOS ABS: 0 10*3/uL (ref 0.0–0.5)
EOS%: 0 % (ref 0.0–7.0)
HCT: 30.3 % — ABNORMAL LOW (ref 34.8–46.6)
HGB: 8.6 g/dL — ABNORMAL LOW (ref 11.6–15.9)
LYMPH%: 6.8 % — AB (ref 14.0–49.7)
MCH: 20.8 pg — AB (ref 25.1–34.0)
MCHC: 28.4 g/dL — ABNORMAL LOW (ref 31.5–36.0)
MCV: 73.2 fL — AB (ref 79.5–101.0)
MONO#: 0.9 10*3/uL (ref 0.1–0.9)
MONO%: 7.1 % (ref 0.0–14.0)
NEUT#: 10.8 10*3/uL — ABNORMAL HIGH (ref 1.5–6.5)
NEUT%: 86 % — ABNORMAL HIGH (ref 38.4–76.8)
Platelets: 638 10*3/uL — ABNORMAL HIGH (ref 145–400)
RBC: 4.14 10*6/uL (ref 3.70–5.45)
RDW: 19 % — ABNORMAL HIGH (ref 11.2–14.5)
WBC: 12.6 10*3/uL — ABNORMAL HIGH (ref 3.9–10.3)
lymph#: 0.9 10*3/uL (ref 0.9–3.3)

## 2015-09-02 LAB — COMPREHENSIVE METABOLIC PANEL
ALBUMIN: 3.1 g/dL — AB (ref 3.5–5.0)
ALK PHOS: 81 U/L (ref 40–150)
ALT: <9 U/L (ref 0–55)
ANION GAP: 10 meq/L (ref 3–11)
AST: 9 U/L (ref 5–34)
BILIRUBIN TOTAL: 0.57 mg/dL (ref 0.20–1.20)
BUN: 9 mg/dL (ref 7.0–26.0)
CO2: 24 mEq/L (ref 22–29)
Calcium: 9.2 mg/dL (ref 8.4–10.4)
Chloride: 102 mEq/L (ref 98–109)
Creatinine: 0.7 mg/dL (ref 0.6–1.1)
EGFR: >90 mL/min/{1.73_m2} (ref >90)
Glucose: 116 mg/dl (ref 70–140)
POTASSIUM: 3.3 meq/L — AB (ref 3.5–5.1)
Sodium: 136 mEq/L (ref 136–145)
TOTAL PROTEIN: 7.8 g/dL (ref 6.4–8.3)

## 2015-09-02 MED ORDER — FERROUS SULFATE 325 (65 FE) MG PO TBEC: 325.0000 mg | DELAYED_RELEASE_TABLET | Freq: Three times a day (TID) | ORAL | Status: DC

## 2015-09-02 NOTE — Telephone Encounter (Signed)
Gave and pritned appt sched and avs for pt for DEC adn march 2017

## 2015-09-02 NOTE — Progress Notes (Signed)
Hematology and Oncology Follow Up Visit  Kayla Patton [id number] August 18, 1976 39 y.o. 09/02/2015 4:05 PM Patton, Kayla P, NPEdwards, [name], NP   Principle Diagnosis: 39 year old woman with iron deficiency anemia and thrombocytosis diagnosed in December 2015. She also has rheumatoid arthritis.   Current therapy: Oral iron supplements. She has stopped since the last visit.  Interim History:  [name] presents today for a follow-up visit with her family accompanied by an interpreter. Since her last visit, she continues to have few complaints. She is debilitated by her rheumatoid arthritis including knee pain and joint pain. Her mobility is limited because of that. She is on prednisone as well as  injections by her rheumatologist. She has stopped oral iron replacement when she ran out of of that medication. She has reported more fatigue and tiredness as well. She also reported some occasional dyspnea on exertion. Did not report any chest pain.   She does not report any bleeding at this time including hematochezia or melena. She does not report any GYN bleeding. Her menstrual cycles have a.She does not report any hematochezia or melena. She does not report any easy bruisability or petechiae.     She does not report any headaches or blurry vision or syncope. Does not report any fevers or chills or sweats. Does not report any weight loss or constitutional symptoms. She does not report any nausea, vomiting, abdominal pain or hematochezia. She does not report any frequency urgency or hesitancy. Rest of her review of systems unremarkable.   Medications: I have reviewed the patient's current medications.  Current Outpatient Prescriptions  Medication Sig Dispense Refill  . Cholecalciferol (VITAMIN D PO) Take 1 tablet by mouth daily.    . folic acid (FOLVITE) 1 MG tablet Take 1 mg by mouth.    . hydrochlorothiazide (HYDRODIURIL) 25 MG tablet Take 25 mg by mouth daily.    .  predniSONE (DELTASONE) 10 MG tablet Take 10 mg by mouth daily with breakfast.     No current facility-administered medications for this visit.     Allergies: No Known Allergies  Past Medical History, Surgical history, Social history, and Family History were reviewed and updated.  Physical Exam: Blood pressure 119/66, pulse 135, temperature 98.2 F (36.8 C), temperature source Oral, resp. rate 18, height 5\' 2"  (1.575 m), weight 187 lb 11.2 oz (85.14 kg), SpO2 100 %. ECOG: 0 General appearance: alert and cooperative appeared in some mild distress. Head: Normocephalic, without obvious abnormality no oral ulcers or lesions. Neck: no adenopathy Lymph nodes: Cervical, supraclavicular, and axillary nodes normal. Heart:regular rate and rhythm, S1, S2 normal, no murmur, click, rub or gallop Lung:chest clear, no wheezing, rales, normal symmetric air entry Abdomin: soft, non-tender, without masses or organomegaly EXT:no erythema, induration, or nodules   Lab Results: Lab Results  Component Value Date   WBC 12.6* 09/02/2015   HGB 8.6* 09/02/2015   HCT 30.3* 09/02/2015   MCV 73.2* 09/02/2015   PLT 638* 09/02/2015     Chemistry      Component Value Date/Time   NA 141 09/10/2014 1355   NA 143 08/25/2014 0426   K 3.1* 09/10/2014 1355   K 3.9 08/25/2014 0426   CL 101 08/25/2014 0426   CO2 28 09/10/2014 1355   CO2 23 08/25/2014 0426   BUN 11.8 09/10/2014 1355   BUN 10 08/25/2014 0426   CREATININE 0.7 09/10/2014 1355   CREATININE 0.60 08/25/2014 0426      Component Value Date/Time   CALCIUM 9.2  09/10/2014 1355   CALCIUM 9.3 08/25/2014 0426   ALKPHOS 119 09/10/2014 1355   ALKPHOS 67 09/30/2009 2143   AST 11 09/10/2014 1355   AST 16 09/30/2009 2143   ALT 19 09/10/2014 1355   ALT 14 09/30/2009 2143   BILITOT 0.22 09/10/2014 1355   BILITOT 0.4 09/30/2009 2143        Impression and Plan:  40 year old woman with the following issues:  1. Iron deficiency anemia: She has been  on oral iron replacement in the past but have been discontinued as of late because she ran out of her medication. Her hemoglobin have declined at this time and likely have developed worsening iron deficiency.  Options of treatment were reviewed today including oral iron replacement as well as IV iron. Given her symptomatology related to profound iron deficiency, she have opted to proceed with IV iron after discussion today. Risks and benefits associated with Feraheme includes infusion related complications such as arthralgias, myalgias and rarely anaphylaxis. That offer her a rather quick her replacement of her iron stores and better symptom control.  I plan on giving her a total of 1 g of IV iron on 2 separate occasions. I have recommended oral iron with displacement to be done as maintenance therapy after that.  2. Thrombocytosis: Likely reactive in nature seems to be improving as we correct her iron.  3. Leukocytosis: Reactive in nature likely related to steroids and rheumatoid arthritis. You do not see any evidence to suggest a hematological disorder.  4. Follow-up: Will be in 3 months to check on her clinical status and repeat her iron studies.   [name], MD 12/6/20164:05 PM

## 2015-09-03 LAB — IRON AND TIBC: TIBC: 313 ug/dL (ref 236–444)

## 2015-09-03 LAB — FERRITIN: Ferritin: <4 ng/ml — ABNORMAL LOW (ref 9–269)

## 2015-09-10 ENCOUNTER — Ambulatory Visit (HOSPITAL_BASED_OUTPATIENT_CLINIC_OR_DEPARTMENT_OTHER): Payer: Self-pay

## 2015-09-10 VITALS — BP 125/59 | HR 110 | Temp 97.6°F

## 2015-09-10 DIAGNOSIS — D509 Iron deficiency anemia, unspecified: Secondary | ICD-10-CM

## 2015-09-10 MED ORDER — SODIUM CHLORIDE 0.9 % IV SOLN
510.0000 mg | Freq: Once | INTRAVENOUS | Status: AC
  Administered 2015-09-10: 510 mg via INTRAVENOUS
  Filled 2015-09-10: qty 17

## 2015-09-10 MED ORDER — SODIUM CHLORIDE 0.9 % IV SOLN
Freq: Once | INTRAVENOUS | Status: AC
  Administered 2015-09-10: 15:00:00 via INTRAVENOUS

## 2015-09-10 NOTE — Patient Instructions (Addendum)
Ferumoxytol injection Qu es este medicamento? El FERUMOXYTOL es un complejo de hierro. El hierro se [place] para la produccin de glbulos rojos sanos, los cuales transportan el oxgeno y los nutrientes hacia todo el cuerpo. Este medicamento se [place] para tratar la anemia por falta de hierro a las personas con enfermedad renal crnica. Este medicamento puede ser utilizado para otros usos; si tiene alguna pregunta consulte con su proveedor de atencin mdica o con su farmacutico. Qu le debo informar a mi profesional de la salud antes de tomar este medicamento? Necesita saber si usted presenta alguno de los [employer] o situaciones: -anemia no provocada por niveles bajos de hierro -niveles altos de hierro en la sangre -examen de imgenes por Health visitor (IRM) programado -una reaccin alrgica o inusual al hierro, otros medicamentos, alimentos, colorantes o conservantes -si est embarazada o buscando quedar embarazada -si est amamantando a un beb Cmo debo Insurance account manager medicamento? Este medicamento se administra mediante inyeccin por va intravenosa. Lo administra un profesional de Technical sales engineer en un hospital o en un entorno clnico. Hable con su pediatra para informarse acerca del uso de este medicamento en nios. Puede requerir atencin especial. Sobredosis: Pngase en contacto inmediatamente con un centro toxicolgico o una sala de urgencia si usted cree que haya tomado demasiado medicamento. ATENCIN: [employer] es solo para usted. No comparta este medicamento con nadie. Qu sucede si me olvido de una dosis? Es importante no olvidar ninguna dosis. Informe a su mdico o a su profesional de la salud si no puede asistir a Photographer. Qu puede interactuar con este medicamento? Esta medicina puede interactuar con los siguientes medicamentos: -otros productos de hierro Puede ser que esta lista no menciona todas las posibles interacciones. Informe a su profesional de  [employer]	[place] de [employer] productos a base de hierbas, medicamentos de [place] o suplementos nutritivos que est tomando. Si usted fuma, consume bebidas alcohlicas o si utiliza drogas ilegales, indqueselo tambin a su profesional de [employer]	[place]. Algunas sustancias pueden interactuar con su medicamento. A qu debo estar atento al usar [employer]? Visite a su mdico o a su profesional de la salud de [place] regular. Si los sntomas no comienzan a mejorar o si empeoran, consulte con su mdico o con su profesional de [employer]	[place]. Tal vez necesita realizarse anlisis de sangre mientras recibe [place]. Tal vez necesita seguir Counselling psychologist. Consulte a su mdico. Los alimentos que contienen hierro incluyen: alimentos integrales o con cereales, frutas secas, frijoles o arvejas, vegetales de hoja verde y carne que proviene de rganos (hgado, rin). Qu efectos secundarios puedo tener al [employer] este medicamento? Efectos secundarios que debe informar a su mdico o a Barrister's clerk de la salud tan pronto como sea posible: -Chief of Staff como erupcin cutnea, picazn o urticarias, hinchazn de la cara, labios o lengua -problemas respiratorios -cambios en la presin sangunea -sensacin de desmayos o mareos, cadas -fiebre o escalofros -enrojecimiento, sudoracin o sensacin de calor -hinchazn de los tobillos o pies Efectos secundarios que, por lo general, no requieren atencin mdica (debe informarlos a su mdico o a su profesional de la salud si persisten o si son molestos): -diarrea -dolor de cabeza -nuseas, vmito -Higher education careers adviser Puede ser que esta lista no menciona todos los posibles efectos secundarios. Comunquese a su mdico por asesoramiento mdico [employer] Usted puede informar los efectos secundarios a la FDA por telfono al 1-800-FDA-1088. Dnde debo guardar mi medicina? Este medicamento se administra en hospitales o clnicas  y no necesitar  guardarlo en su domicilio. ATENCIN: Este folleto es un resumen. Puede ser que no cubra toda la posible informacin. Si usted tiene preguntas acerca de esta medicina, consulte con su mdico, su farmacutico o su profesional de Technical sales engineer.    2016, [employer]. (2014-11-06 00:00:00)

## 2015-09-17 ENCOUNTER — Ambulatory Visit (HOSPITAL_BASED_OUTPATIENT_CLINIC_OR_DEPARTMENT_OTHER): Payer: Self-pay

## 2015-09-17 VITALS — BP 154/86 | HR 104 | Temp 96.9°F

## 2015-09-17 DIAGNOSIS — D509 Iron deficiency anemia, unspecified: Secondary | ICD-10-CM

## 2015-09-17 MED ORDER — SODIUM CHLORIDE 0.9 % IV SOLN
510.0000 mg | Freq: Once | INTRAVENOUS | Status: AC
  Administered 2015-09-17: 510 mg via INTRAVENOUS
  Filled 2015-09-17: qty 17

## 2015-09-17 MED ORDER — SODIUM CHLORIDE 0.9 % IV SOLN
Freq: Once | INTRAVENOUS | Status: AC
Start: 2015-09-17 — End: 2015-09-17
  Administered 2015-09-17: 15:00:00 via INTRAVENOUS

## 2015-09-17 NOTE — Patient Instructions (Signed)
Ferumoxytol injection Qu es este medicamento? El FERUMOXYTOL es un complejo de hierro. El hierro se [place] para la produccin de glbulos rojos sanos, los cuales transportan el oxgeno y los nutrientes hacia todo el cuerpo. Este medicamento se [place] para tratar la anemia por falta de hierro a las personas con enfermedad renal crnica. Este medicamento puede ser utilizado para otros usos; si tiene alguna pregunta consulte con su proveedor de atencin mdica o con su farmacutico. Qu le debo informar a mi profesional de la salud antes de tomar este medicamento? Necesita saber si usted presenta alguno de los [employer] o situaciones: -anemia no provocada por niveles bajos de hierro -niveles altos de hierro en la sangre -examen de imgenes por Health visitor (IRM) programado -una reaccin alrgica o inusual al hierro, otros medicamentos, alimentos, colorantes o conservantes -si est embarazada o buscando quedar embarazada -si est amamantando a un beb Cmo debo Insurance account manager medicamento? Este medicamento se administra mediante inyeccin por va intravenosa. Lo administra un profesional de Technical sales engineer en un hospital o en un entorno clnico. Hable con su pediatra para informarse acerca del uso de este medicamento en nios. Puede requerir atencin especial. Sobredosis: Pngase en contacto inmediatamente con un centro toxicolgico o una sala de urgencia si usted cree que haya tomado demasiado medicamento. ATENCIN: [employer] es solo para usted. No comparta este medicamento con nadie. Qu sucede si me olvido de una dosis? Es importante no olvidar ninguna dosis. Informe a su mdico o a su profesional de la salud si no puede asistir a Photographer. Qu puede interactuar con este medicamento? Esta medicina puede interactuar con los siguientes medicamentos: -otros productos de hierro Puede ser que esta lista no menciona todas las posibles interacciones. Informe a su profesional de  [employer]	[place] de [employer] productos a base de hierbas, medicamentos de [place] o suplementos nutritivos que est tomando. Si usted fuma, consume bebidas alcohlicas o si utiliza drogas ilegales, indqueselo tambin a su profesional de [employer]	[place]. Algunas sustancias pueden interactuar con su medicamento. A qu debo estar atento al usar [employer]? Visite a su mdico o a su profesional de la salud de Speed regular. Si los sntomas no comienzan a mejorar o si empeoran, consulte con su mdico o con su profesional de [employer]	[place]. Tal vez necesita realizarse anlisis de sangre mientras recibe [place]. Tal vez necesita seguir Counselling psychologist. Consulte a su mdico. Los alimentos que contienen hierro incluyen: alimentos integrales o con cereales, frutas secas, frijoles o arvejas, vegetales de hoja verde y carne que proviene de rganos (hgado, rin). Qu efectos secundarios puedo tener al [employer] este medicamento? Efectos secundarios que debe informar a su mdico o a Barrister's clerk de la salud tan pronto como sea posible: -Chief of Staff como erupcin cutnea, picazn o urticarias, hinchazn de la cara, labios o lengua -problemas respiratorios -cambios en la presin sangunea -sensacin de desmayos o mareos, cadas -fiebre o escalofros -enrojecimiento, sudoracin o sensacin de calor -hinchazn de los tobillos o pies Efectos secundarios que, por lo general, no requieren atencin mdica (debe informarlos a su mdico o a su profesional de la salud si persisten o si son molestos): -diarrea -dolor de cabeza -nuseas, vmito -Higher education careers adviser Puede ser que esta lista no menciona todos los posibles efectos secundarios. Comunquese a su mdico por asesoramiento mdico [employer]. Usted puede informar los efectos secundarios a la FDA por telfono al 1-800-FDA-1088. Dnde debo guardar mi medicina? Este medicamento se administra en hospitales o clnicas  y no necesitar  guardarlo en su domicilio. ATENCIN: Este folleto es un resumen. Puede ser que no cubra toda la posible informacin. Si usted tiene preguntas acerca de esta medicina, consulte con su mdico, su farmacutico o su profesional de Technical sales engineer.    2016, Elsevier/Gold Standard. (2014-11-06 00:00:00)

## 2015-11-17 ENCOUNTER — Encounter (HOSPITAL_COMMUNITY): Payer: Self-pay | Admitting: *Deleted

## 2015-11-17 ENCOUNTER — Emergency Department (HOSPITAL_COMMUNITY)
Admission: EM | Admit: 2015-11-17 | Discharge: 2015-11-17 | Disposition: A | Discharge status: Home / Self Care | Payer: Self-pay | Attending: Emergency Medicine | Admitting: Emergency Medicine

## 2015-11-17 DIAGNOSIS — R000 Tachycardia, unspecified: Secondary | ICD-10-CM | POA: Insufficient documentation

## 2015-11-17 DIAGNOSIS — M25561 Pain in right knee: Secondary | ICD-10-CM | POA: Insufficient documentation

## 2015-11-17 DIAGNOSIS — M06062 Rheumatoid arthritis without rheumatoid factor, left knee: Secondary | ICD-10-CM | POA: Insufficient documentation

## 2015-11-17 DIAGNOSIS — I10 Essential (primary) hypertension: Secondary | ICD-10-CM | POA: Insufficient documentation

## 2015-11-17 DIAGNOSIS — R0602 Shortness of breath: Secondary | ICD-10-CM | POA: Insufficient documentation

## 2015-11-17 DIAGNOSIS — M069 Rheumatoid arthritis, unspecified: Secondary | ICD-10-CM

## 2015-11-17 DIAGNOSIS — G8929 Other chronic pain: Secondary | ICD-10-CM | POA: Insufficient documentation

## 2015-11-17 DIAGNOSIS — M1990 Unspecified osteoarthritis, unspecified site: Secondary | ICD-10-CM

## 2015-11-17 DIAGNOSIS — Z79899 Other long term (current) drug therapy: Secondary | ICD-10-CM | POA: Insufficient documentation

## 2015-11-17 DIAGNOSIS — Z7952 Long term (current) use of systemic steroids: Secondary | ICD-10-CM | POA: Insufficient documentation

## 2015-11-17 MED ORDER — OXYCODONE-ACETAMINOPHEN 5-325 MG PO TABS: 1.0000 | ORAL_TABLET | ORAL | Status: DC | PRN

## 2015-11-17 MED ORDER — OXYCODONE-ACETAMINOPHEN 5-325 MG PO TABS
1.0000 | ORAL_TABLET | Freq: Once | ORAL | Status: AC
  Administered 2015-11-17: 1 via ORAL
  Filled 2015-11-17: qty 1

## 2015-11-17 MED ORDER — DEXAMETHASONE SODIUM PHOSPHATE 10 MG/ML IJ SOLN
10.0000 mg | Freq: Once | INTRAMUSCULAR | Status: AC
  Administered 2015-11-17: 10 mg via INTRAMUSCULAR
  Filled 2015-11-17: qty 1

## 2015-11-17 MED ORDER — KETOROLAC TROMETHAMINE 60 MG/2ML IM SOLN
30.0000 mg | Freq: Once | INTRAMUSCULAR | Status: AC
  Administered 2015-11-17: 30 mg via INTRAMUSCULAR
  Filled 2015-11-17: qty 2

## 2015-11-17 MED ORDER — MELOXICAM 7.5 MG PO TABS: 7.5000 mg | ORAL_TABLET | Freq: Every day | ORAL | Status: DC | PRN

## 2015-11-17 NOTE — Discharge Instructions (Signed)
Read the information below.  Use the prescribed medication as directed.  Please discuss all new medications with your pharmacist.  Do not take additional tylenol while taking the prescribed pain medication to avoid overdose.  You may return to the Emergency Department at any time for worsening condition or any new symptoms that concern you.  If you develop uncontrolled pain, weakness or numbness of the extremity, severe discoloration of the skin, or you are unable to walk, return to the ER for a recheck.     Lea la informacin a continuacin. Use la medicacin prescrita como se indica. Por favor discuta todos los nuevos medicamentos con su farmacutico. No tome tylenol adicional mientras toma el medicamento para el dolor prescrito para evitar la sobredosis. Usted puede regresar al [employer] de Emergencias en cualquier momento por empeoramiento de la condicin o cualquier nuevo sntoma que le afecte. Si usted desarrolla dolor incontrolado, debilidad o entumecimiento de la extremidad, decoloracin severa de la piel, o no puede caminar, regrese a la sala de emergencias para una nueva revisin.   Artritis reumatoidea (Rheumatoid Arthritis) La artritis reumatoidea es una enfermedad inflamatoria a largo plazo (crnica) que causa dolor, hinchazn y rigidez de las articulaciones. Puede afectar a todo el organismo, incluidos los ojos y los pulmones. Los efectos varan en gran medida entre los que sufren esta enfermedad. CAUSAS La causa de esta enfermedad no se conoce. Suele ser hereditaria y es ms frecuente entre mujeres. Ciertas clulas del sistema de defensa natural del organismo (sistema inmunitario) no funcionan adecuadamente y comienzan a Clinical biochemist. Afecta principalmente al tejido conjuntivo que alinea las articulaciones (membrana sinovial). Esto puede causar una lesin en la articulacin. SNTOMAS  Dolor, entumecimiento, hinchazn y disminucin de la movilidad en varias  articulaciones, especialmente en las manos y los pies.  El entumecimiento empeora por la maana. Puede durar entre 1 y 2 horas, o ms.  Adormecimiento u hormigueo en las manos.  Fatiga.  Prdida del apetito.  Prdida de peso.  Fiebre no muy elevada.  Ojos y boca secos.  Bultos firmes (ndulos reumatoideos) que crecen por debajo de la piel en zonas como los codos y las manos. DIAGNSTICO El diagnstico se realiza segn los sntomas descriptos, un examen y [place] de [place]. Algunas veces es necesario tomar radiografas. TRATAMIENTO Los [employer] del tratamiento son Best boy, reducir la inflamacin y disminuir o Ambulance person lesin en las articulaciones y la discapacidad. Los mtodos varan y pueden ser:  Theatre manager un equilibrio entre reposo, [place] fsica y nutricin [place].  El mdico puede ajustarle los medicamentos cada 24meses hasta tanto se alcancen los objetivos del [place]. Los medicamentos comunes incluyen los siguientes:  Analgsicos.  Corticoides y antiinflamatorios no esteroides para reducir la inflamacin.  Antirreumticos modificadores de la enfermedad (ARME) para disminuir el avance de la artritis reumatoidea.  Modificadores de respuesta biolgica para reducir la inflamacin y las lesiones.  Fisioterapia y terapia ocupacional.  Ciruga en aquellos pacientes con lesiones graves en las articulaciones. Puede ser necesario el reemplazo o fusin de las articulaciones.  Los controles de [place] y el cuidado continuo, como visitas al [place], anlisis de [place] y [place], y Manufacturing engineer. El mdico trabajar con usted para Pension scheme manager la opcin de tratamiento ms [place] para su caso en funcin de una evaluacin de la actividad general de la enfermedad en su organismo. INSTRUCCIONES PARA EL CUIDADO EN EL HOGAR  Mantngase fsicamente activo y Psychologist, prison and probation services la actividad cuando la enfermedad [place].  Consuma una dieta bien balanceada.  Aplquese  calor en las  articulaciones afectadas al despertarse y antes de [employer]. Mantenga el calor sobre la articulacin afectada durante el tiempo que le indique su mdico.  Aplquese hielo en las articulaciones afectadas luego de realizar actividades o de Geneticist, molecular.  Ponga el hielo en una bolsa plstica.  Coloque una toalla entre la piel y la bolsa de hielo.  Deje el hielo durante 15 a 60minutos, 3 a 4veces por da, o [place] los medicamentos y los suplementos solamente como se lo haya indicado el mdico.  Use las frulas segn las indicaciones de su mdico. Las frulas mantienen la funcin y la posicin de las articulaciones.  No duerma con almohadas debajo de las rodillas. Esto puede causarle espasmos.  Participe en un programa de [place] para mantenerse actualizado con los ltimos tratamientos y modos de Engineer, civil (consulting) enfermedad. SOLICITE ATENCIN MDICA DE INMEDIATO SI:  Sufre un desmayo.  Por momentos, siente debilidad extrema.  Desarrolla rpidamente calor y dolor en una articulacin que son ms intensos que los dolores articulares habituales.  Tiene escalofros.  Tiene fiebre. PARA OBTENER [place] Lexicographer of Rheumatology): www.rheumatology.org.  Fundacin contra la Artritis ([place]): www.arthritis.org   Esta informacin no tiene Marine scientist el consejo del mdico. Asegrese de hacerle al mdico cualquier pregunta que tenga.   Document Released: 06/23/2005 Document Revised: 10/04/2014 Elsevier Interactive Patient Education [employer].

## 2015-11-17 NOTE — ED Notes (Signed)
Pt in c/o bilateral knee pain and swelling for the last few days, occasional redness to left knee, states she has had to have fluid aspirated off in the past, increased pain with movement

## 2015-11-17 NOTE — ED Provider Notes (Signed)
CSN: [id number]     Arrival date & time 11/17/15  2031 History  By signing my name below, I, Dora Sims, attest that this documentation has been prepared under the direction and in the presence of non-physician practitioner, Clayton Bibles, PA-C. Electronically Signed: Dora Sims, Scribe. 11/17/2015. 8:39 PM.      Chief Complaint  Patient presents with  . Knee Pain    The history is provided by the patient. No language interpreter was used.     HPI Comments: Kayla Patton is a 40 y.o. female with h/o rheumatoid arthritis who presents to the Emergency Department complaining of gradual onset, constant, pain and swelling in her bilateral knees for the past few days. Pt reports uncontrolled pain in her knees for 2 years due to rheumatoid arthritis. She states that she went to her rheumatologist three weeks ago who began weaning her off of Prednisone by subtracting 5 mg every week. She states that when she got down to 15 mg daily she developed pain in all of her joints and the most severe pain in her knees. She notes that she feels SOB when the pain is most severe. Pt denies any recent falls or injuries. She reports that she is able to move her right knee but not her left knee. Pt has used methotrexate for the same issue but does not take it currently. She is on 10 mg Prednisone daily and injections by her rheumatologist. She denies fever, chest pain or any other associated symptoms at this time.  Denies any recent falls or injury.    Past Medical History  Diagnosis Date  . Arthritis   . Hypertension    Past Surgical History  Procedure Laterality Date  . Appendectomy     History reviewed. No pertinent family history. Social History  Substance Use Topics  . Smoking status: Never Smoker   . Smokeless tobacco: None  . Alcohol Use: No   OB History    No data available     Review of Systems  Constitutional: Negative for fever and chills.  Respiratory: Positive for shortness  of breath.   Cardiovascular: Negative for chest pain.  Musculoskeletal: Positive for joint swelling and arthralgias. Negative for myalgias.  Skin: Negative for color change, pallor, rash and wound.  Neurological: Negative for weakness and numbness.  Psychiatric/Behavioral: Negative for self-injury.      Allergies  Review of patient's allergies indicates no known allergies.  Home Medications   Prior to Admission medications   Medication Sig Start Date End Date Taking? Authorizing Provider  Cholecalciferol (VITAMIN D PO) Take 1 tablet by mouth daily.    Historical Provider, MD  ferrous sulfate 325 (65 FE) MG EC tablet Take 1 tablet (325 mg total) by mouth 3 (three) times daily with meals. 09/02/15   Wyatt Portela, MD  hydrochlorothiazide (HYDRODIURIL) 25 MG tablet Take 25 mg by mouth daily.    Historical Provider, MD  predniSONE (DELTASONE) 10 MG tablet Take 10 mg by mouth daily with breakfast.    Historical Provider, MD   BP 135/83 mmHg  Pulse 121  Temp(Src) 98.4 F (36.9 C) (Oral)  Resp 17  SpO2 100% Physical Exam  Constitutional: She appears well-developed and well-nourished. No distress.  Uncomfortable appearing  HENT:  Head: Normocephalic and atraumatic.  Eyes: Conjunctivae are normal.  Neck: Neck supple.  Cardiovascular: Regular rhythm.  Tachycardia present.   Pulmonary/Chest: Effort normal and breath sounds normal.  Abdominal: Soft. She exhibits no distension. There is no tenderness.  Musculoskeletal:  Bilateral knee tenderness and edema, L>R.  No erythema or warmth.  Slight decreased ROM secondary to pain.  No calf edema or tenderness.  Sensation intact.  Distal pulses intact.    Neurological: She is alert. She exhibits normal muscle tone.  Skin: She is not diaphoretic.  Nursing note and vitals reviewed.   ED Course  Procedures (including critical care time)  DIAGNOSTIC STUDIES: Oxygen Saturation is 100% on RA, normal by my interpretation.    COORDINATION OF  CARE: 8:39 PM Will administer Decadron injection, Oxycodone, and Toradol injection. Discussed treatment plan with pt at bedside and pt agreed to plan.   Labs Review Labs Reviewed - No data to display  Imaging Review No results found.    EKG Interpretation None       10:32 PM Pt reports she feels much better after medications.  She is able to range her knee more fully and is able to bear weight.  She has a walker at home to use as needed.    MDM   Final diagnoses:  Rheumatoid arthritis involving left knee, unspecified rheumatoid factor presence (HCC)  Chronic arthritis    Afebrile, nontoxic patient with chronic joint pain from rheumatoid arthritis, treated with prednisone, no pain medications at home.  Treated by rheumatologist.  Has been tapering prednisone, causing pain to increase - this has happened previously when they attempted to taper the prednisone.  Doubt septic joint, doubt fracture or other injury.  Pt feeling great improvement after PO percocet IM decadron, IM toradol.   D/C home with short course pain medication, rheumatology follow up.  Discussed result, findings, treatment, and follow up  with patient.  Pt given return precautions.  Pt verbalizes understanding and agrees with plan.       I personally performed the services described in this documentation, which was scribed in my presence. The recorded information has been reviewed and is accurate.      Clayton Bibles, PA-C 11/17/15 [id number]  Daleen Bo, MD 11/18/15 [phone]

## 2015-12-02 ENCOUNTER — Other Ambulatory Visit (HOSPITAL_BASED_OUTPATIENT_CLINIC_OR_DEPARTMENT_OTHER): Payer: Self-pay

## 2015-12-02 ENCOUNTER — Ambulatory Visit (HOSPITAL_BASED_OUTPATIENT_CLINIC_OR_DEPARTMENT_OTHER): Payer: Self-pay | Admitting: Oncology

## 2015-12-02 ENCOUNTER — Telehealth: Payer: Self-pay | Admitting: Oncology

## 2015-12-02 VITALS — BP 123/77 | HR 120 | Temp 99.1°F | Resp 18 | Wt 183.8 lb

## 2015-12-02 DIAGNOSIS — D509 Iron deficiency anemia, unspecified: Secondary | ICD-10-CM

## 2015-12-02 DIAGNOSIS — M069 Rheumatoid arthritis, unspecified: Secondary | ICD-10-CM

## 2015-12-02 DIAGNOSIS — D72829 Elevated white blood cell count, unspecified: Secondary | ICD-10-CM

## 2015-12-02 DIAGNOSIS — D508 Other iron deficiency anemias: Secondary | ICD-10-CM

## 2015-12-02 DIAGNOSIS — D473 Essential (hemorrhagic) thrombocythemia: Secondary | ICD-10-CM

## 2015-12-02 LAB — CBC WITH DIFFERENTIAL/PLATELET
BASO%: 0.4 % (ref 0.0–2.0)
Basophils Absolute: 0 10*3/uL (ref 0.0–0.1)
EOS ABS: 0.1 10*3/uL (ref 0.0–0.5)
EOS%: 0.5 % (ref 0.0–7.0)
HCT: 35 % (ref 34.8–46.6)
HEMOGLOBIN: 11.4 g/dL — AB (ref 11.6–15.9)
LYMPH#: 1.7 10*3/uL (ref 0.9–3.3)
LYMPH%: 14.8 % (ref 14.0–49.7)
MCH: 28.7 pg (ref 25.1–34.0)
MCHC: 32.5 g/dL (ref 31.5–36.0)
MCV: 88.3 fL (ref 79.5–101.0)
MONO#: 1.2 10*3/uL — AB (ref 0.1–0.9)
MONO%: 10.3 % (ref 0.0–14.0)
NEUT%: 74 % (ref 38.4–76.8)
NEUTROS ABS: 8.6 10*3/uL — AB (ref 1.5–6.5)
PLATELETS: 579 10*3/uL — AB (ref 145–400)
RBC: 3.96 10*6/uL (ref 3.70–5.45)
RDW: 19.8 % — ABNORMAL HIGH (ref 11.2–14.5)
WBC: 11.6 10*3/uL — ABNORMAL HIGH (ref 3.9–10.3)

## 2015-12-02 LAB — IRON AND TIBC
%SAT: 8 % — AB (ref 21–57)
IRON: 14 ug/dL — AB (ref 41–142)
TIBC: 181 ug/dL — ABNORMAL LOW (ref 236–444)
UIBC: 167 ug/dL (ref 120–384)

## 2015-12-02 LAB — FERRITIN: Ferritin: 75 ng/ml (ref 9–269)

## 2015-12-02 NOTE — Progress Notes (Signed)
Hematology and Oncology Follow Up Visit  Kayla Patton [id number] 09/15/1976 40 y.o. 12/02/2015 1:23 PM Kayla Patton, NPEdwards, [name], NP   Principle Diagnosis: 40 year old woman with iron deficiency anemia and thrombocytosis diagnosed in December 2015. She also has rheumatoid arthritis.  Past therapy: She is status post IV iron for a total of 1 g of Feraheme given on 09/10/2015 and 09/17/2015.  Current therapy: Oral iron supplements. 325 mg ferrous sulfate once daily.  Interim History:  Ms Kayla Patton presents today for a follow-up visit accompanied by an interpreter. Since her last visit, she continues to report symptoms of arthritis predominantly in her feet and knees. She did receive intravenous iron and helped her symptoms of fatigue and tiredness. She did not report any complications related to that. She denied any infusion-related issues or arthralgias. She is currently taking oral iron without any side effects. She denies any dyspepsia or constipation.   Her mobility is adequate without the help of a walker or cane. Denies any falls or syncope. She is following up with rheumatology regarding her arthritis issues.   She does not report any bleeding at this time including hematochezia or melena. She continues to have irregular menses and menorrhagia at times.   She does not report any headaches or blurry vision or syncope. Does not report any fevers or chills or sweats. Does not report any weight loss or constitutional symptoms. She does not report any nausea, vomiting, abdominal pain or hematochezia. She does not report any frequency urgency or hesitancy. Rest of her review of systems unremarkable.   Medications: I have reviewed the patient's current medications.  Current Outpatient Prescriptions  Medication Sig Dispense Refill  . Cholecalciferol (VITAMIN D PO) Take 1 tablet by mouth daily.    . ferrous sulfate 325 (65 FE) MG EC tablet Take 1 tablet (325 mg  total) by mouth 3 (three) times daily with meals. 30 tablet 3  . hydrochlorothiazide (HYDRODIURIL) 25 MG tablet Take 25 mg by mouth daily.    . meloxicam (MOBIC) 7.5 MG tablet Take 1 tablet (7.5 mg total) by mouth daily as needed for pain. 14 tablet 0  . predniSONE (DELTASONE) 10 MG tablet Take 10 mg by mouth daily with breakfast.     No current facility-administered medications for this visit.     Allergies: No Known Allergies  Past Medical History, Surgical history, Social history, and Family History were reviewed and updated.  Physical Exam: Blood pressure 123/77, pulse 120, temperature 99.1 F (37.3 C), temperature source Oral, resp. rate 18, weight 183 lb 12.8 oz (83.371 kg), SpO2 100 %. ECOG: 0 General appearance: alert and cooperative appeared comfortable without distress. Head: Normocephalic, without obvious abnormality no oral thrush noted. Neck: no adenopathy Lymph nodes: Cervical, supraclavicular, and axillary nodes normal. Heart:regular rate and rhythm, S1, S2 normal, no murmur, click, rub or gallop Lung:chest clear, no wheezing, rales, normal symmetric air entry Abdomin: soft, non-tender, without masses or organomegaly shifting dullness or ascites. EXT:no erythema, induration, or nodules   Lab Results: Lab Results  Component Value Date   WBC 11.6* 12/02/2015   HGB 11.4* 12/02/2015   HCT 35.0 12/02/2015   MCV 88.3 12/02/2015   PLT 579* 12/02/2015     Chemistry      Component Value Date/Time   NA 136 09/02/2015 1535   NA 143 08/25/2014 0426   K 3.3* 09/02/2015 1535   K 3.9 08/25/2014 0426   CL 101 08/25/2014 0426   CO2 24 09/02/2015 1535  CO2 23 08/25/2014 0426   BUN 9.0 09/02/2015 1535   BUN 10 08/25/2014 0426   CREATININE 0.7 09/02/2015 1535   CREATININE 0.60 08/25/2014 0426      Component Value Date/Time   CALCIUM 9.2 09/02/2015 1535   CALCIUM 9.3 08/25/2014 0426   ALKPHOS 81 09/02/2015 1535   ALKPHOS 67 09/30/2009 2143   AST 9 09/02/2015 1535    AST 16 09/30/2009 2143   ALT <9 09/02/2015 1535   ALT 14 09/30/2009 2143   BILITOT 0.57 09/02/2015 1535   BILITOT 0.4 09/30/2009 2143        Impression and Plan:  40 year old woman with the following issues:  1. Iron deficiency anemia: Diagnosed in December 2015 related due to menstrual losses and irregular periods. She have been erratic and her oral iron intake and status post IV iron in December 2016.  Her laboratory data were reviewed today and her hemoglobin have improved dramatically. She is currently on oral iron replacement for maintenance purposes which I have recommended continuing once a day. I will continue to check her counts periodically and replace and intravenously as needed.  2. Thrombocytosis: Related to iron deficiency and is improving upon correction of her iron deficiency anemia.  3. Leukocytosis: Reactive in nature likely related to steroids and rheumatoid arthritis. This is improving without any sign of a blood disorder.  4. Rheumatoid arthritis: She continues to follow with dermatology regarding this issue.  5. Follow-up: Will be in 3 months to check on her clinical status and repeat her iron studies.   [name], MD 3/7/20171:23 PM

## 2015-12-02 NOTE — Telephone Encounter (Signed)
Gave and printed appt sched and avs for pt for June °

## 2016-03-10 ENCOUNTER — Ambulatory Visit (HOSPITAL_BASED_OUTPATIENT_CLINIC_OR_DEPARTMENT_OTHER): Payer: Self-pay | Admitting: Oncology

## 2016-03-10 ENCOUNTER — Telehealth: Payer: Self-pay | Admitting: Oncology

## 2016-03-10 VITALS — BP 132/75 | HR 95 | Temp 98.4°F | Resp 19 | Ht 62.0 in | Wt 194.2 lb

## 2016-03-10 DIAGNOSIS — D509 Iron deficiency anemia, unspecified: Secondary | ICD-10-CM

## 2016-03-10 DIAGNOSIS — D72829 Elevated white blood cell count, unspecified: Secondary | ICD-10-CM

## 2016-03-10 DIAGNOSIS — D473 Essential (hemorrhagic) thrombocythemia: Secondary | ICD-10-CM

## 2016-03-10 DIAGNOSIS — M069 Rheumatoid arthritis, unspecified: Secondary | ICD-10-CM

## 2016-03-10 NOTE — Progress Notes (Signed)
Hematology and Oncology Follow Up Visit  Fynnley Feider [id number] [birth year] 40 y.o. 03/10/2016 2:05 PM EDWARDS, MICHELLE P, NPEdwards, [name], NP   Principle Diagnosis: 40 year old woman with iron deficiency anemia and thrombocytosis diagnosed in December 2015. She also has rheumatoid arthritis.  Past therapy: She is status post IV iron for a total of 1 g of Feraheme given on 09/10/2015 and 09/17/2015.  Current therapy: Oral iron supplements. 325 mg ferrous sulfate once daily.  Interim History:  [name] presents today for a follow-up visit accompanied by family members. Since her last visit, she reports doing very well. She continues to take oral iron without complications. She denies any dyspepsia or constipation. She reports improved energy and performance status.  Her mobility is excellent at this time and does not require any more assistance. She does not require any walkers or wheelchairs. Her arthritis have been reasonably controlled.  She does not report any headaches or blurry vision or syncope. Does not report any fevers or chills or sweats. Does not report any weight loss or constitutional symptoms. She does not report any nausea, vomiting, abdominal pain or hematochezia. She does not report any frequency urgency or hesitancy. Rest of her review of systems unremarkable.   Medications: I have reviewed the patient's current medications.  Current Outpatient Prescriptions  Medication Sig Dispense Refill  . Cholecalciferol (VITAMIN D PO) Take 1 tablet by mouth daily.    . ferrous sulfate 325 (65 FE) MG EC tablet Take 1 tablet (325 mg total) by mouth 3 (three) times daily with meals. 30 tablet 3  . folic acid (FOLVITE) 1 MG tablet Take 1 tablet by mouth daily.    Marland Kitchen lisinopril-hydrochlorothiazide (PRINZIDE,ZESTORETIC) 10-12.5 MG tablet Take 1 tablet by mouth daily.  0  . omeprazole (PRILOSEC) 20 MG capsule Take 20 mg by mouth daily.    . predniSONE (DELTASONE)  5 MG tablet Take 10 mg by mouth daily.    Marland Kitchen sulfaSALAzine (AZULFIDINE) 500 MG tablet Take 1,000 mg by mouth 2 (two) times daily.      No current facility-administered medications for this visit.     Allergies: No Known Allergies  Past Medical History, Surgical history, Social history, and Family History were reviewed and updated.  Physical Exam: Blood pressure 132/75, pulse 95, temperature 98.4 F (36.9 C), temperature source Oral, resp. rate 19, height 5\' 2"  (1.575 m), weight 194 lb 3.2 oz (88.089 kg), SpO2 100 %. ECOG: 0 General appearance: Pleasant-appearing woman without distress. Head: Normocephalic, without obvious abnormality no oral ulcers or lesions. Neck: no adenopathy Lymph nodes: Cervical, supraclavicular, and axillary nodes normal. Heart:regular rate and rhythm, S1, S2 normal, no murmur, click, rub or gallop Lung:chest clear, no wheezing, rales, normal symmetric air entry Abdomin: soft, non-tender, without masses or organomegaly no rebound or guarding. EXT:no erythema, induration, or nodules   Lab Results: Lab Results  Component Value Date   WBC 11.6* 12/02/2015   HGB 11.4* 12/02/2015   HCT 35.0 12/02/2015   MCV 88.3 12/02/2015   PLT 579* 12/02/2015     Chemistry      Component Value Date/Time   NA 136 09/02/2015 1535   NA 143 08/25/2014 0426   K 3.3* 09/02/2015 1535   K 3.9 08/25/2014 0426   CL 101 08/25/2014 0426   CO2 24 09/02/2015 1535   CO2 23 08/25/2014 0426   BUN 9.0 09/02/2015 1535   BUN 10 08/25/2014 0426   CREATININE 0.7 09/02/2015 1535   CREATININE 0.60 08/25/2014 0426  Component Value Date/Time   CALCIUM 9.2 09/02/2015 1535   CALCIUM 9.3 08/25/2014 0426   ALKPHOS 81 09/02/2015 1535   ALKPHOS 67 09/30/2009 2143   AST 9 09/02/2015 1535   AST 16 09/30/2009 2143   ALT <9 09/02/2015 1535   ALT 14 09/30/2009 2143   BILITOT 0.57 09/02/2015 1535   BILITOT 0.4 09/30/2009 2143        Impression and Plan:  40 year old woman with the  following issues:  1. Iron deficiency anemia: Diagnosed in December 2015 related due to menstrual losses and irregular periods. She have been erratic and her oral iron intake and status post IV iron in December 2016.  Her laboratory data from March 2017 were reviewed today including her iron studies. I have recommended continuing oral iron for the time being and we'll repeat her iron studies in 3 months. IV iron can be used if her oral iron is not successful in maintaining iron stores. She has no objections continuing oral iron as she have tolerated it well.  2. Thrombocytosis: Related to iron deficiency and is improving upon correction of her iron deficiency anemia.  3. Leukocytosis: Reactive in nature likely related to steroids and rheumatoid arthritis. T  4. Rheumatoid arthritis: She continues to follow with rheumatology and seems to be much improved. Her ambulation and pain is much improved.  5. Follow-up: Will be in 3 months to check on her clinical status and repeat her iron studies.   [facility], MD 6/14/20172:05 PM

## 2016-03-10 NOTE — Telephone Encounter (Signed)
Gave and printed appt sched and avs for pt for Sept °

## 2016-05-23 IMAGING — CT CT ANGIO CHEST
1 of 2 series · 19 of 32 positions shown · IV contrast (OMNIPAQUE 300)
Comparison: None.

CLINICAL DATA: Acute onset of shortness of breath and sharp mid
back pain. Mild leukocytosis. Initial encounter.

EXAM:
CT ANGIOGRAPHY CHEST WITH CONTRAST
TECHNIQUE: Multidetector CT imaging of the chest was performed using the
standard protocol during bolus administration of intravenous
contrast. Multiplanar CT image reconstructions and MIPs were
obtained to evaluate the vascular anatomy.
CONTRAST:  100mL OMNIPAQUE IOHEXOL 350 MG/ML SOLN

[Series 6: thins for pacs · axial · 0.72mm/px · z∈[-262,-17]mm · 19 of 269 slices shown]
[im 12/269  lung]
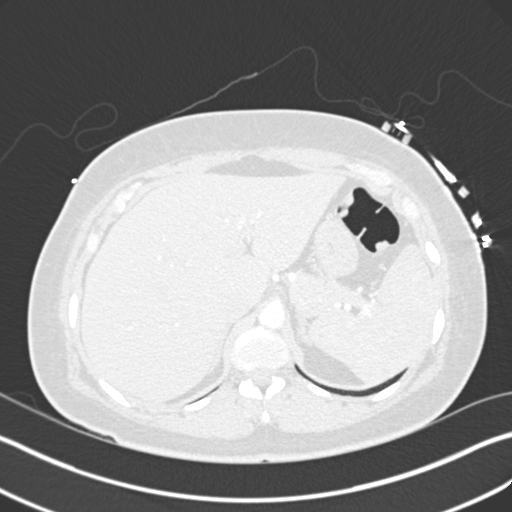
[im 24/269  soft-tissue]
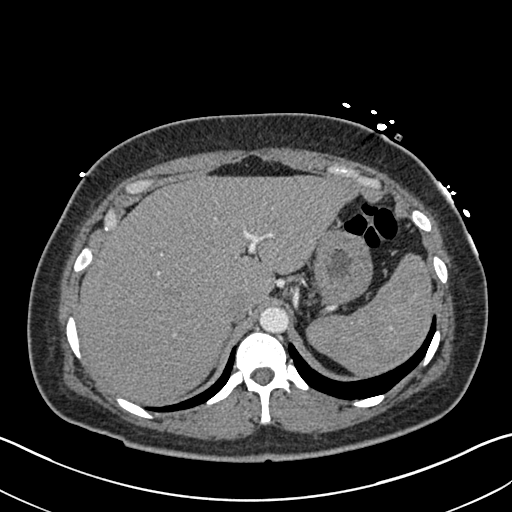
[im 35/269  lung]
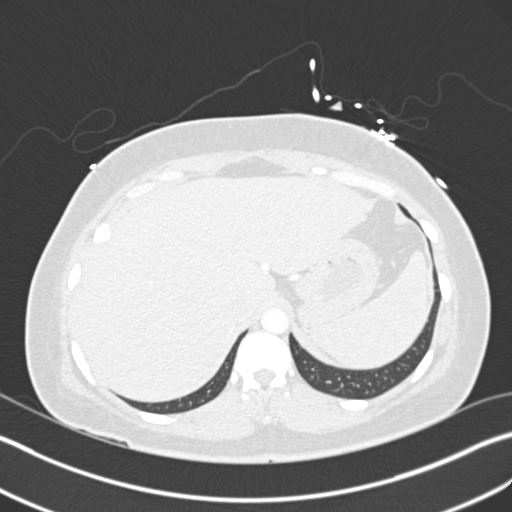
[im 59/269  soft-tissue]
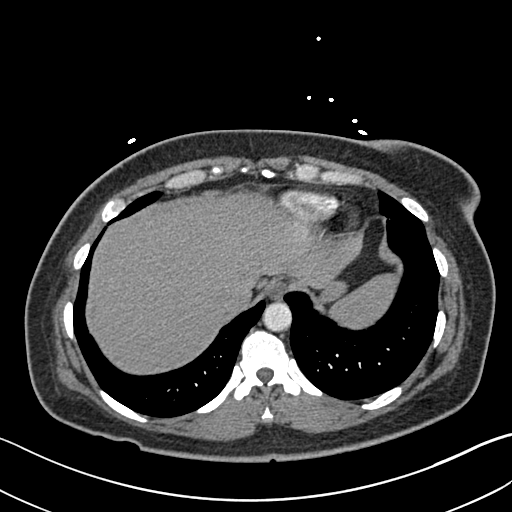
[im 70/269  lung]
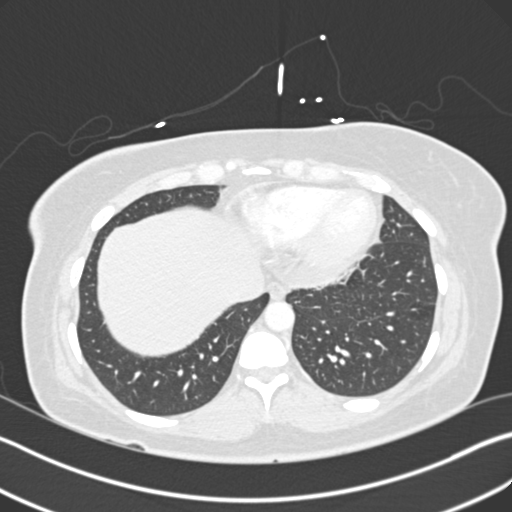
[im 82/269  soft-tissue]
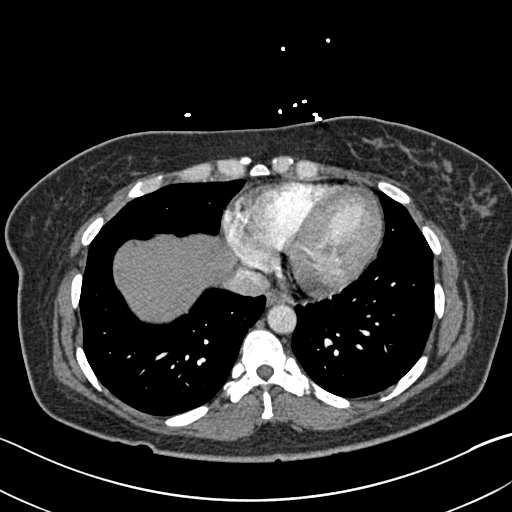
[im 94/269  lung]
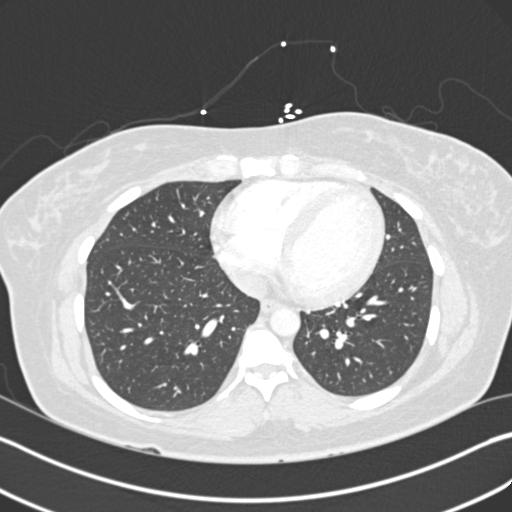
[im 105/269  soft-tissue]
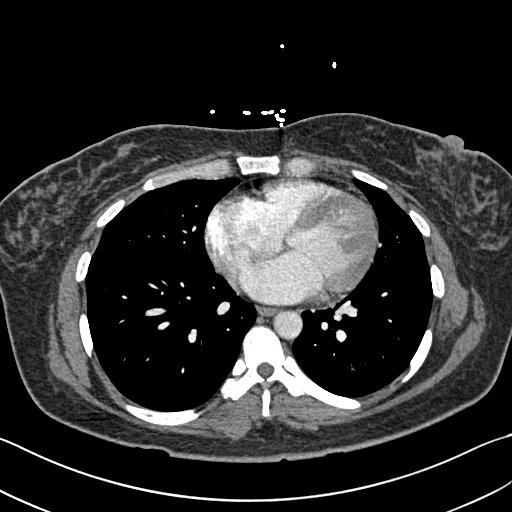
[im 117/269  lung]
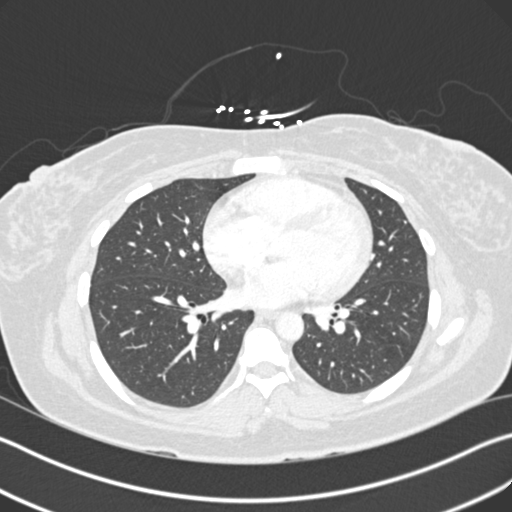
[im 140/269  soft-tissue]
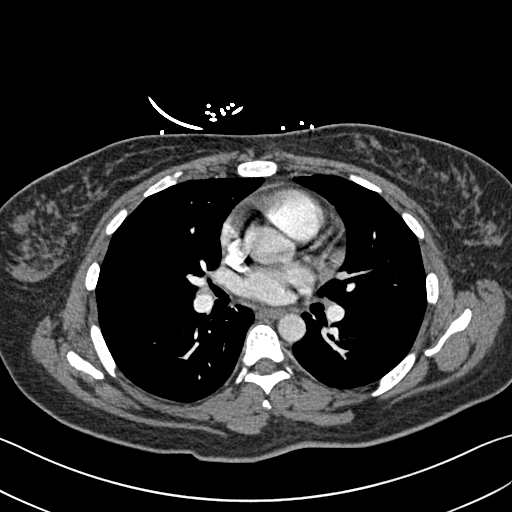
[im 152/269  lung]
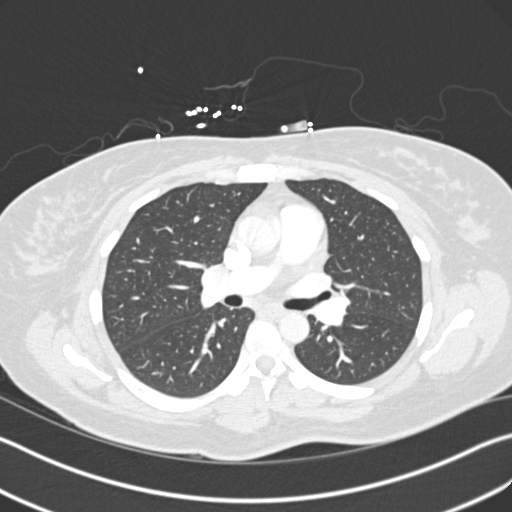
[im 164/269  soft-tissue]
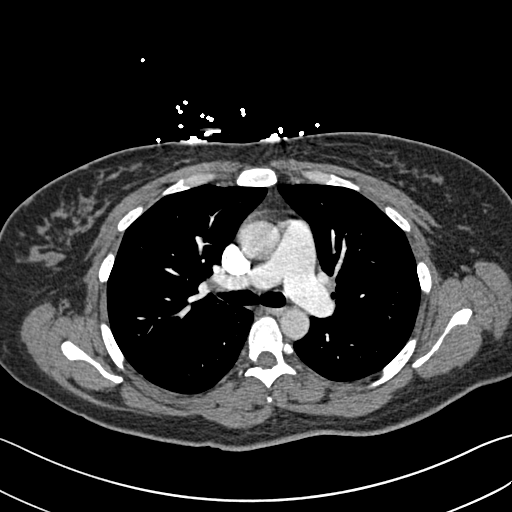
[im 175/269  lung]
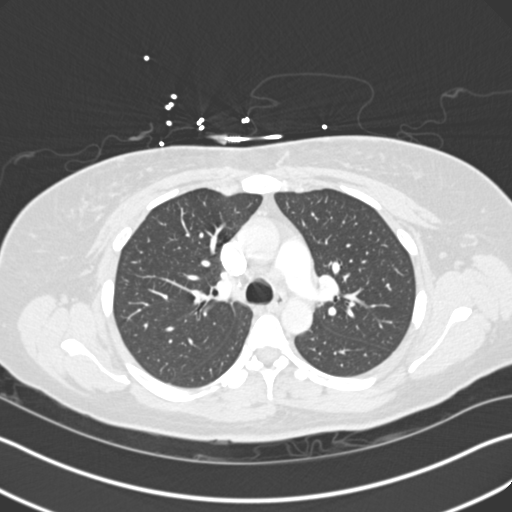
[im 187/269  soft-tissue]
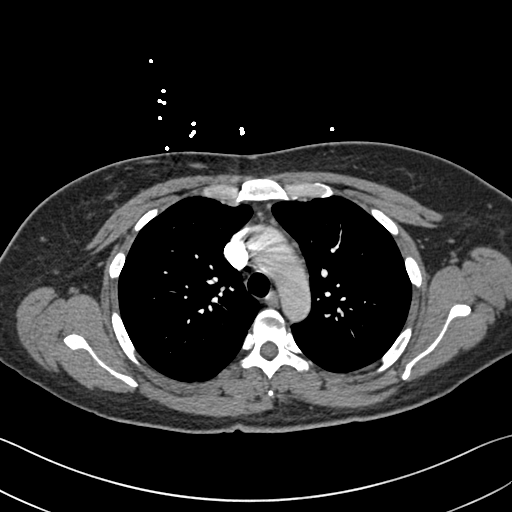
[im 199/269  lung]
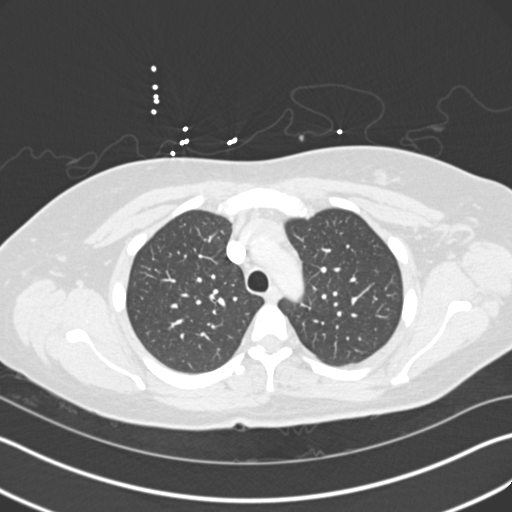
[im 210/269  soft-tissue]
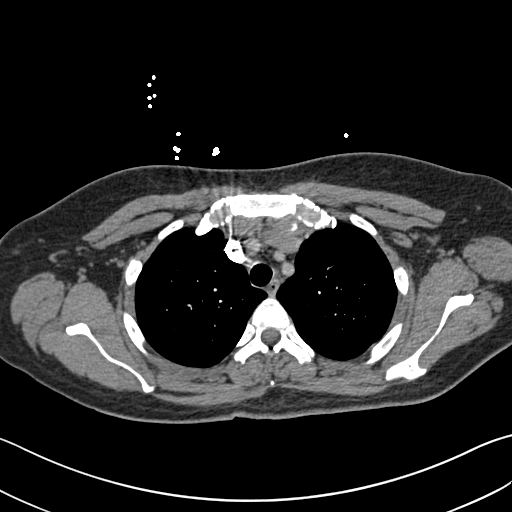
[im 234/269  lung]
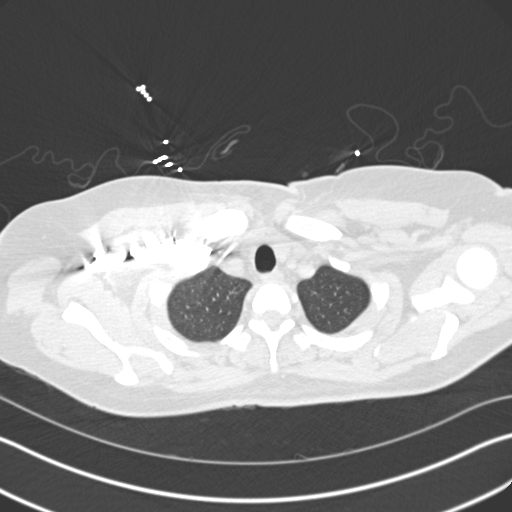
[im 245/269  soft-tissue]
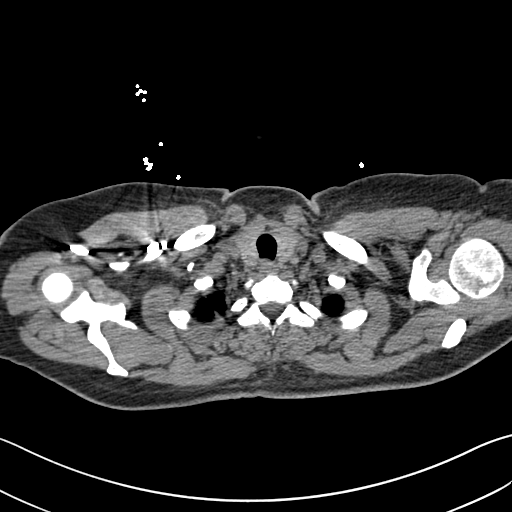
[im 257/269  lung]
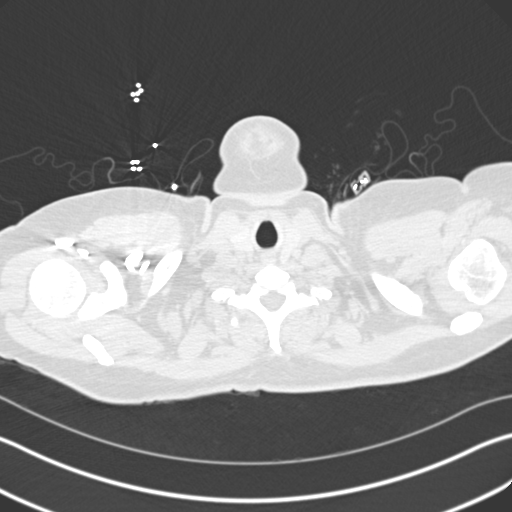

[19 of 32 positions shown; findings below may reference images not displayed]

FINDINGS: There is no evidence of pulmonary embolus.

The lungs appear clear bilaterally. There is no evidence of
significant focal consolidation, pleural effusion or pneumothorax.
No masses are identified; no abnormal focal contrast enhancement is
seen.

The mediastinum is unremarkable in appearance. No mediastinal
lymphadenopathy is seen. No pericardial effusion is identified. The
great vessels are grossly unremarkable in appearance. No axillary
lymphadenopathy is seen. The visualized portions of the thyroid
gland are unremarkable in appearance.

The visualized portions of the liver and spleen are unremarkable.

No acute osseous abnormalities are seen.

Review of the MIP images confirms the above findings.
IMPRESSION: 1. No evidence of pulmonary embolus.
2. Lungs clear bilaterally.

## 2016-06-10 ENCOUNTER — Ambulatory Visit (HOSPITAL_BASED_OUTPATIENT_CLINIC_OR_DEPARTMENT_OTHER): Payer: Self-pay | Admitting: Oncology

## 2016-06-10 ENCOUNTER — Telehealth: Payer: Self-pay | Admitting: Oncology

## 2016-06-10 ENCOUNTER — Other Ambulatory Visit (HOSPITAL_BASED_OUTPATIENT_CLINIC_OR_DEPARTMENT_OTHER): Payer: Self-pay

## 2016-06-10 VITALS — BP 123/77 | HR 97 | Temp 98.0°F | Resp 18 | Ht 62.0 in | Wt 198.7 lb

## 2016-06-10 DIAGNOSIS — D509 Iron deficiency anemia, unspecified: Secondary | ICD-10-CM

## 2016-06-10 DIAGNOSIS — D72829 Elevated white blood cell count, unspecified: Secondary | ICD-10-CM

## 2016-06-10 DIAGNOSIS — M069 Rheumatoid arthritis, unspecified: Secondary | ICD-10-CM

## 2016-06-10 LAB — IRON AND TIBC
%SAT: 37 % (ref 21–57)
Iron: 122 ug/dL (ref 41–142)
TIBC: 325 ug/dL (ref 236–444)
UIBC: 203 ug/dL (ref 120–384)

## 2016-06-10 LAB — FERRITIN: FERRITIN: 8 ng/mL — AB (ref 9–269)

## 2016-06-10 LAB — COMPREHENSIVE METABOLIC PANEL
ALT: 14 U/L (ref 0–55)
ANION GAP: 8 meq/L (ref 3–11)
AST: 12 U/L (ref 5–34)
Albumin: 3.4 g/dL — ABNORMAL LOW (ref 3.5–5.0)
Alkaline Phosphatase: 76 U/L (ref 40–150)
BUN: 10.3 mg/dL (ref 7.0–26.0)
CALCIUM: 9.8 mg/dL (ref 8.4–10.4)
CHLORIDE: 102 meq/L (ref 98–109)
CO2: 30 meq/L — AB (ref 22–29)
CREATININE: 0.8 mg/dL (ref 0.6–1.1)
Glucose: 86 mg/dl (ref 70–140)
Potassium: 4 mEq/L (ref 3.5–5.1)
Sodium: 140 mEq/L (ref 136–145)
Total Bilirubin: 0.61 mg/dL (ref 0.20–1.20)
Total Protein: 7.3 g/dL (ref 6.4–8.3)

## 2016-06-10 LAB — CBC WITH DIFFERENTIAL/PLATELET
BASO%: 0.3 % (ref 0.0–2.0)
BASOS ABS: 0 10*3/uL (ref 0.0–0.1)
EOS%: 0.5 % (ref 0.0–7.0)
Eosinophils Absolute: 0.1 10*3/uL (ref 0.0–0.5)
HEMATOCRIT: 39.9 % (ref 34.8–46.6)
HGB: 13 g/dL (ref 11.6–15.9)
LYMPH#: 3.1 10*3/uL (ref 0.9–3.3)
LYMPH%: 26.9 % (ref 14.0–49.7)
MCH: 29.3 pg (ref 25.1–34.0)
MCHC: 32.6 g/dL (ref 31.5–36.0)
MCV: 90.1 fL (ref 79.5–101.0)
MONO#: 1 10*3/uL — ABNORMAL HIGH (ref 0.1–0.9)
MONO%: 8.5 % (ref 0.0–14.0)
NEUT#: 7.4 10*3/uL — ABNORMAL HIGH (ref 1.5–6.5)
NEUT%: 63.8 % (ref 38.4–76.8)
PLATELETS: 347 10*3/uL (ref 145–400)
RBC: 4.43 10*6/uL (ref 3.70–5.45)
RDW: 15.3 % — ABNORMAL HIGH (ref 11.2–14.5)
WBC: 11.6 10*3/uL — ABNORMAL HIGH (ref 3.9–10.3)

## 2016-06-10 NOTE — Telephone Encounter (Signed)
Gave patient avs report and appointments for March  °

## 2016-06-10 NOTE — Progress Notes (Signed)
Hematology and Oncology Follow Up Visit  Mardel Marsico [id number] [birth year] 40 y.o. 06/10/2016 1:33 PM EDWARDS, MICHELLE P, NPEdwards, [name], NP   Principle Diagnosis: 41 year old woman with iron deficiency anemia and thrombocytosis diagnosed in December 2015. She also has rheumatoid arthritis.  Past therapy: She is status post IV iron for a total of 1 g of Feraheme given on 09/10/2015 and 09/17/2015.  Current therapy: Oral iron supplements. 325 mg ferrous sulfate once daily.  Interim History:  [name] presents today for a follow-up visit accompanied by family members and an interpreter. Since her last visit, she reports continuing to feel well without any recent complaints. Her energy and performance status improved.  She continues to take oral iron without complications. She denies any dyspepsia or constipation. She denied any hematochezia, melena or menorrhagia.  She does not report any headaches or blurry vision or syncope. Does not report any fevers or chills or sweats. Does not report any weight loss or constitutional symptoms. She does not report any nausea, vomiting, abdominal pain or hematochezia. She does not report any frequency urgency or hesitancy. Rest of her review of systems unremarkable.   Medications: I have reviewed the patient's current medications.  Current Outpatient Prescriptions  Medication Sig Dispense Refill  . ferrous sulfate 325 (65 FE) MG EC tablet Take 1 tablet (325 mg total) by mouth 3 (three) times daily with meals. 30 tablet 3  . folic acid (FOLVITE) 1 MG tablet Take 1 tablet by mouth daily.    Marland Kitchen lisinopril-hydrochlorothiazide (PRINZIDE,ZESTORETIC) 10-12.5 MG tablet Take 1 tablet by mouth daily.  0  . Methotrexate Sodium (METHOTREXATE, PF,) 200 MG/8ML injection Inject 0.8 mLs into the skin every 7 (seven) days.     Marland Kitchen ORENCIA CLICKJECT 0000000 MG/ML SOAJ Inject 1 Syringe into the skin every 7 (seven) days.  11  . predniSONE (DELTASONE)  10 MG tablet Take 10 mg by mouth daily with breakfast.     No current facility-administered medications for this visit.      Allergies: No Known Allergies  Past Medical History, Surgical history, Social history, and Family History were reviewed and updated.  Physical Exam: Blood pressure 123/77, pulse 97, temperature 98 F (36.7 C), temperature source Oral, resp. rate 18, height 5\' 2"  (1.575 m), weight 198 lb 11.2 oz (90.1 kg), SpO2 98 %. ECOG: 0 General appearance: Well-appearing woman without distress. Head: Normocephalic, without obvious abnormality no oral thrush noted. Neck: no adenopathy Lymph nodes: Cervical, supraclavicular, and axillary nodes normal. Heart:regular rate and rhythm, S1, S2 normal, no murmur, click, rub or gallop Lung:chest clear, no wheezing, rales, normal symmetric air entry Abdomin: soft, non-tender, without masses or organomegaly no shifting dullness or ascites. EXT:no erythema, induration, or nodules   Lab Results: Lab Results  Component Value Date   WBC 11.6 (H) 06/10/2016   HGB 13.0 06/10/2016   HCT 39.9 06/10/2016   MCV 90.1 06/10/2016   PLT 347 06/10/2016     Chemistry      Component Value Date/Time   NA 136 09/02/2015 1535   K 3.3 (L) 09/02/2015 1535   CL 101 08/25/2014 0426   CO2 24 09/02/2015 1535   BUN 9.0 09/02/2015 1535   CREATININE 0.7 09/02/2015 1535      Component Value Date/Time   CALCIUM 9.2 09/02/2015 1535   ALKPHOS 81 09/02/2015 1535   AST 9 09/02/2015 1535   ALT <9 09/02/2015 1535   BILITOT 0.57 09/02/2015 1535        Impression and  Plan:  40 year old woman with the following issues:  1. Iron deficiency anemia: Diagnosed in December 2015 related due to menstrual losses and irregular periods. She have been erratic and her oral iron intake and status post IV iron in December 2016.  Her hemoglobin is back to normal at this time and she has been taking oral iron on a daily basis. Her iron studies are currently  pending but I anticipate significant improvement.  I have recommended continue oral iron replacement and we will check her counts in 6 months. Intravenous iron will be used in the future if she is intolerant to oral iron.  2. Thrombocytosis: Related to iron deficiency and has resolved at this time.  3. Leukocytosis: Reactive in nature likely related to steroids and rheumatoid arthritis. White cell count close to normal.  4. Rheumatoid arthritis: She continues to follow with rheumatology and seems to be much improved. Her ambulation and pain is much improved.  5. Follow-up: Will be in 6 months to check on her clinical status and repeat her iron studies.   [name], MD 9/14/20171:33 PM

## 2016-11-27 ENCOUNTER — Encounter (HOSPITAL_COMMUNITY): Payer: Self-pay | Admitting: Emergency Medicine

## 2016-11-27 ENCOUNTER — Emergency Department (HOSPITAL_COMMUNITY)
Admission: EM | Admit: 2016-11-27 | Discharge: 2016-11-27 | Disposition: A | Discharge status: Home / Self Care | Payer: Self-pay | Attending: Emergency Medicine | Admitting: Emergency Medicine

## 2016-11-27 DIAGNOSIS — Z8739 Personal history of other diseases of the musculoskeletal system and connective tissue: Secondary | ICD-10-CM | POA: Insufficient documentation

## 2016-11-27 DIAGNOSIS — Z79899 Other long term (current) drug therapy: Secondary | ICD-10-CM | POA: Insufficient documentation

## 2016-11-27 DIAGNOSIS — M25562 Pain in left knee: Secondary | ICD-10-CM | POA: Insufficient documentation

## 2016-11-27 DIAGNOSIS — I10 Essential (primary) hypertension: Secondary | ICD-10-CM | POA: Insufficient documentation

## 2016-11-27 DIAGNOSIS — M25561 Pain in right knee: Secondary | ICD-10-CM | POA: Insufficient documentation

## 2016-11-27 MED ORDER — OXYCODONE-ACETAMINOPHEN 5-325 MG PO TABS: 1.0000 | ORAL_TABLET | Freq: Four times a day (QID) | ORAL | 0 refills | Status: DC | PRN

## 2016-11-27 MED ORDER — OXYCODONE-ACETAMINOPHEN 5-325 MG PO TABS
2.0000 | ORAL_TABLET | Freq: Once | ORAL | Status: AC
  Administered 2016-11-27: 2 via ORAL
  Filled 2016-11-27: qty 2

## 2016-11-27 NOTE — Discharge Instructions (Signed)
Continue your medications as before.  Begin taking Percocet as prescribed tonight as needed for pain.  Please follow-up with your rheumatologist on Monday.

## 2016-11-27 NOTE — ED Notes (Addendum)
Bilateral knee swelling noted, left worse than right.  Patient states that she is having a worse pain in right than left.  Use of pacific interpreter for assessment with RN and MD.

## 2016-11-27 NOTE — ED Triage Notes (Signed)
Pt presnts to ED for assessment of bilateral knee pain related to arthritis and right elbow pain.  Pt states it has been worse the past three days, especially bad in the morning, but now Tylenol is no longer relieving pain, even at night.  Pt also states she has prednisone at home.  Pt denies any recent injuries.

## 2016-11-27 NOTE — ED Provider Notes (Signed)
[facility] DEPT Provider Note   CSN: [id number] Arrival date & time: 11/27/16  0031     History   Chief Complaint Chief Complaint  Patient presents with  . Knee Pain  . Arthritis    HPI Kayla Patton is a 41 y.o. female.  Patient is a 41 year old female with past mental history of rheumatoid arthritis followed by a rheumatologist in [place]. She presents today with complaints of worsening bilateral knee pain over the past several weeks. She is currently taking methotrexate and prednisone, however her pain is worsened. She denies any new injury or trauma. She denies any fevers or chills.   The history is provided by the patient.  Knee Pain   This is a chronic problem. The problem occurs constantly. The problem has been gradually worsening. Pain location: Both knees. The quality of the pain is described as aching. The pain is severe. Associated symptoms include limited range of motion and stiffness. Pertinent negatives include no numbness. The symptoms are aggravated by standing and activity. She has tried nothing for the symptoms.  Arthritis     Past Medical History:  Diagnosis Date  . Arthritis   . Hypertension     Patient Active Problem List   Diagnosis Date Noted  . Iron deficiency anemia 09/02/2015  . KNEE PAIN, LEFT 12/16/2009  . SIALADENITIS, RIGHT 11/12/2009  . ALLERGIC RHINITIS WITH CONJUNCTIVITIS 09/30/2009  . FATIGUE 09/30/2009  . 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 COMMON MIGRAINE 05/22/2007  . GERD 05/22/2007    Past Surgical History:  Procedure Laterality Date  . APPENDECTOMY      OB History    No data available       Home Medications    Prior to Admission medications   Medication Sig Start Date End Date Taking? Authorizing Provider  ferrous sulfate 325 (65 FE) MG EC tablet Take 1 tablet (325 mg total) by mouth 3 (three) times daily with meals. 09/02/15   Wyatt Portela, MD  folic acid (FOLVITE) 1 MG tablet Take 1 tablet  by mouth daily. 12/24/15   Historical Provider, MD  lisinopril-hydrochlorothiazide (PRINZIDE,ZESTORETIC) 10-12.5 MG tablet Take 1 tablet by mouth daily. 02/02/16   Historical Provider, MD  Methotrexate Sodium (METHOTREXATE, PF,) 200 MG/8ML injection Inject 0.8 mLs into the skin every 7 (seven) days.  01/07/16   Historical Provider, MD  ORENCIA CLICKJECT 0000000 MG/ML SOAJ Inject 1 Syringe into the skin every 7 (seven) days. 03/24/16   Historical Provider, MD  predniSONE (DELTASONE) 10 MG tablet Take 10 mg by mouth daily with breakfast. 03/10/16   Historical Provider, MD    Family History History reviewed. No pertinent family history.  Social History Social History  Substance Use Topics  . Smoking status: Never Smoker  . Smokeless tobacco: Never Used  . Alcohol use No     Allergies   Patient has no known allergies.   Review of Systems Review of Systems  Musculoskeletal: Positive for arthritis and stiffness.  Neurological: Negative for numbness.  All other systems reviewed and are negative.    Physical Exam Updated Vital Signs BP (!) 158/102 (BP Location: Left Arm)   Pulse 103   Temp 97.6 F (36.4 C) (Oral)   Resp 20   LMP 11/21/2016   SpO2 100%   Physical Exam  Constitutional: She is oriented to person, place, and time. She appears well-developed and well-nourished. No distress.  HENT:  Head: Normocephalic and atraumatic.  Neck: Normal range of motion. Neck supple.  Musculoskeletal:  Both  knees have palpable effusions. There is no crepitus or instability. There is no warmth or erythema.  Neurological: She is alert and oriented to person, place, and time.  Skin: Skin is warm and dry. She is not diaphoretic.  Nursing note and vitals reviewed.    ED Treatments / Results  Labs (all labs ordered are listed, but only abnormal results are displayed) Labs Reviewed - No data to display  EKG  EKG Interpretation None       Radiology No results  found.  Procedures Procedures (including critical care time)  Medications Ordered in ED Medications  oxyCODONE-acetaminophen (PERCOCET/ROXICET) 5-325 MG per tablet 2 tablet (not administered)     Initial Impression / Assessment and Plan / ED Course  I have reviewed the triage vital signs and the nursing notes.  Pertinent labs & imaging results that were available during my care of the patient were reviewed by me and considered in my medical decision making (see chart for details).  Patient with bilateral knee pain related to a flareup of rheumatoid arthritis. She is currently taking both methotrexate and prednisone. She'll be prescribed pain medication and advised to follow-up with her rheumatologist this week.  Final Clinical Impressions(s) / ED Diagnoses   Final diagnoses:  None    New Prescriptions New Prescriptions   No medications on file     Veryl Speak, MD 11/27/16 [phone]

## 2016-12-09 ENCOUNTER — Other Ambulatory Visit (HOSPITAL_BASED_OUTPATIENT_CLINIC_OR_DEPARTMENT_OTHER): Payer: Self-pay

## 2016-12-09 ENCOUNTER — Ambulatory Visit (HOSPITAL_BASED_OUTPATIENT_CLINIC_OR_DEPARTMENT_OTHER): Payer: Self-pay | Admitting: Oncology

## 2016-12-09 ENCOUNTER — Telehealth: Payer: Self-pay | Admitting: Oncology

## 2016-12-09 VITALS — BP 159/91 | HR 92 | Temp 98.0°F | Resp 17 | Ht 62.0 in | Wt 200.8 lb

## 2016-12-09 DIAGNOSIS — M069 Rheumatoid arthritis, unspecified: Secondary | ICD-10-CM

## 2016-12-09 DIAGNOSIS — D72829 Elevated white blood cell count, unspecified: Secondary | ICD-10-CM

## 2016-12-09 DIAGNOSIS — D509 Iron deficiency anemia, unspecified: Secondary | ICD-10-CM

## 2016-12-09 LAB — CBC WITH DIFFERENTIAL/PLATELET
BASO%: 0.1 % (ref 0.0–2.0)
BASOS ABS: 0 10*3/uL (ref 0.0–0.1)
EOS ABS: 0 10*3/uL (ref 0.0–0.5)
EOS%: 0.3 % (ref 0.0–7.0)
HCT: 45 % (ref 34.8–46.6)
HEMOGLOBIN: 14.4 g/dL (ref 11.6–15.9)
LYMPH%: 12.4 % — AB (ref 14.0–49.7)
MCH: 29.1 pg (ref 25.1–34.0)
MCHC: 32 g/dL (ref 31.5–36.0)
MCV: 90.9 fL (ref 79.5–101.0)
MONO#: 0.8 10*3/uL (ref 0.1–0.9)
MONO%: 6.7 % (ref 0.0–14.0)
NEUT#: 9.9 10*3/uL — ABNORMAL HIGH (ref 1.5–6.5)
NEUT%: 80.5 % — ABNORMAL HIGH (ref 38.4–76.8)
PLATELETS: 364 10*3/uL (ref 145–400)
RBC: 4.95 10*6/uL (ref 3.70–5.45)
RDW: 16.8 % — AB (ref 11.2–14.5)
WBC: 12.2 10*3/uL — ABNORMAL HIGH (ref 3.9–10.3)
lymph#: 1.5 10*3/uL (ref 0.9–3.3)

## 2016-12-09 NOTE — Progress Notes (Signed)
Hematology and Oncology Follow Up Visit  Kayla Patton [id number] [birth year] 41 y.o. 12/09/2016 1:39 PM Kayla Patton, NPEdwards, [name], NP   Principle Diagnosis: 41 year old woman with iron deficiency anemia and thrombocytosis diagnosed in December 2015. She also has rheumatoid arthritis.  Past therapy: She is status post IV iron for a total of 1 g of Feraheme given on 09/10/2015 and 09/17/2015.  Current therapy: Oral iron supplements. 325 mg ferrous sulfate once daily.  Interim History:  Kayla Patton presents today for a follow-up visit accompanied by family members and an interpreter. Since her last visit, she continues to do reasonably well. She still has issues with her blood pressure that has been erratic in nature. She still taken oral iron without any major complications. Her energy and performance status improved. She denies any dyspepsia or constipation. She denied any hematochezia, melena or menorrhagia. She does report some issues with joint pain despite being on prednisone for her rheumatoid arthritis.  She does not report any headaches or blurry vision or syncope. Does not report any fevers or chills or sweats. Does not report any weight loss or constitutional symptoms. She does not report any nausea, vomiting, abdominal pain or hematochezia. She does not report any frequency urgency or hesitancy. Rest of her review of systems unremarkable.   Medications: I have reviewed the patient's current medications.  Current Outpatient Prescriptions  Medication Sig Dispense Refill  . ferrous sulfate 325 (65 FE) MG EC tablet Take 1 tablet (325 mg total) by mouth 3 (three) times daily with meals. 30 tablet 3  . folic acid (FOLVITE) 1 MG tablet Take 1 tablet by mouth daily.    Marland Kitchen lisinopril-hydrochlorothiazide (PRINZIDE,ZESTORETIC) 10-12.5 MG tablet Take 1 tablet by mouth daily.  0  . Methotrexate Sodium (METHOTREXATE, PF,) 200 MG/8ML injection Inject 0.8 mLs into  the skin every 7 (seven) days.     Marland Kitchen ORENCIA CLICKJECT 702 MG/ML SOAJ Inject 1 Syringe into the skin every 7 (seven) days.  11  . oxyCODONE-acetaminophen (PERCOCET) 5-325 MG tablet Take 1-2 tablets by mouth every 6 (six) hours as needed. 20 tablet 0  . predniSONE (DELTASONE) 10 MG tablet Take 10 mg by mouth daily with breakfast.     No current facility-administered medications for this visit.      Allergies: No Known Allergies  Past Medical History, Surgical history, Social history, and Family History were reviewed and updated.  Physical Exam: Blood pressure (!) 159/91, pulse 92, temperature 98 F (36.7 C), resp. rate 17, height 5\' 2"  (1.575 m), weight 200 lb 12.8 oz (91.1 kg), last menstrual period 11/21/2016, SpO2 100 %. ECOG: 0 General appearance: Alert, awake woman without distress. Head: Normocephalic, without obvious abnormality no oral ulcers or lesions. Neck: no adenopathy Lymph nodes: Cervical, supraclavicular, and axillary nodes normal. Heart:regular rate and rhythm, S1, S2 normal, no murmur, click, rub or gallop Lung:chest clear, no wheezing, rales, normal symmetric air entry Abdomin: soft, non-tender, without masses or organomegaly no rebound or guarding. EXT:no erythema, induration, or nodules   Lab Results: Lab Results  Component Value Date   WBC 12.2 (H) 12/09/2016   HGB 14.4 12/09/2016   HCT 45.0 12/09/2016   MCV 90.9 12/09/2016   PLT 364 12/09/2016     Chemistry      Component Value Date/Time   NA 140 06/10/2016 1303   K 4.0 06/10/2016 1303   CL 101 08/25/2014 0426   CO2 30 (H) 06/10/2016 1303   BUN 10.3 06/10/2016 1303   CREATININE  0.8 06/10/2016 1303      Component Value Date/Time   CALCIUM 9.8 06/10/2016 1303   ALKPHOS 76 06/10/2016 1303   AST 12 06/10/2016 1303   ALT 14 06/10/2016 1303   BILITOT 0.61 06/10/2016 1303        Impression and Plan:  41 year old woman with the following issues:  1. Iron deficiency anemia: Diagnosed in  December 2015 related due to menstrual losses and irregular periods. She have been erratic and her oral iron intake and status post IV iron in December 2016.  Her hemoglobin is back to normal at this time and she has been taking oral iron on a daily basis. Her iron studies From previous evaluation appear to be within normal range.  I have recommended continue oral iron replacement and we will check her counts in 6 months. Intravenous iron will be used in the future if she is intolerant to oral iron.  2. Leukocytosis: Reactive in nature likely related to steroids and rheumatoid arthritis. White cell count remains slightly elevated.  3. Rheumatoid arthritis: She continues to follow with rheumatology and seems to be much improved. Her ambulation and pain is much improved.  4. Follow-up: Will be in 6 months to check on her clinical status and repeat her iron studies.   [name], MD 3/15/20181:39 PM

## 2016-12-09 NOTE — Telephone Encounter (Signed)
Appointments scheduled per 3/15 LOS. Patient given AVS report and calendars with future scheduled appointments. °

## 2016-12-13 LAB — IRON AND TIBC
%SAT: 69 % — AB (ref 21–57)
IRON: 249 ug/dL — AB (ref 41–142)
TIBC: 363 ug/dL (ref 236–444)
UIBC: 113 ug/dL — ABNORMAL LOW (ref 120–384)

## 2016-12-13 LAB — FERRITIN: Ferritin: 6 ng/ml — ABNORMAL LOW (ref 9–269)

## 2017-06-09 ENCOUNTER — Other Ambulatory Visit (HOSPITAL_BASED_OUTPATIENT_CLINIC_OR_DEPARTMENT_OTHER): Payer: Self-pay

## 2017-06-09 ENCOUNTER — Telehealth: Payer: Self-pay | Admitting: Oncology

## 2017-06-09 ENCOUNTER — Ambulatory Visit (HOSPITAL_BASED_OUTPATIENT_CLINIC_OR_DEPARTMENT_OTHER): Payer: Self-pay | Admitting: Oncology

## 2017-06-09 VITALS — BP 123/72 | HR 99 | Temp 98.2°F | Resp 97 | Ht 62.0 in | Wt 199.8 lb

## 2017-06-09 DIAGNOSIS — D509 Iron deficiency anemia, unspecified: Secondary | ICD-10-CM

## 2017-06-09 LAB — CBC WITH DIFFERENTIAL/PLATELET
BASO%: 0.2 % (ref 0.0–2.0)
Basophils Absolute: 0 10*3/uL (ref 0.0–0.1)
EOS ABS: 0 10*3/uL (ref 0.0–0.5)
EOS%: 0.1 % (ref 0.0–7.0)
HEMATOCRIT: 35.3 % (ref 34.8–46.6)
HGB: 12.1 g/dL (ref 11.6–15.9)
LYMPH#: 0.8 10*3/uL — AB (ref 0.9–3.3)
LYMPH%: 6.7 % — ABNORMAL LOW (ref 14.0–49.7)
MCH: 31.5 pg (ref 25.1–34.0)
MCHC: 34.1 g/dL (ref 31.5–36.0)
MCV: 92.3 fL (ref 79.5–101.0)
MONO#: 0.8 10*3/uL (ref 0.1–0.9)
MONO%: 6.3 % (ref 0.0–14.0)
NEUT%: 86.7 % — AB (ref 38.4–76.8)
NEUTROS ABS: 10.7 10*3/uL — AB (ref 1.5–6.5)
PLATELETS: 491 10*3/uL — AB (ref 145–400)
RBC: 3.83 10*6/uL (ref 3.70–5.45)
RDW: 13.2 % (ref 11.2–14.5)
WBC: 12.3 10*3/uL — ABNORMAL HIGH (ref 3.9–10.3)

## 2017-06-09 MED ORDER — FERROUS SULFATE 325 (65 FE) MG PO TBEC: 325.0000 mg | DELAYED_RELEASE_TABLET | Freq: Three times a day (TID) | ORAL | 3 refills | Status: AC

## 2017-06-09 NOTE — Telephone Encounter (Signed)
Scheduled appt per 9/13 los - Gave patient AVS and calender per los.  

## 2017-06-09 NOTE — Progress Notes (Signed)
Hematology and Oncology Follow Up Visit  Dreanna Kyllo [id number] 1976-01-21 41 y.o. 06/09/2017 3:27 PM Kerin Perna, NPEdwards, [name], NP   Principle Diagnosis: 41 year old woman with iron deficiency anemia and thrombocytosis diagnosed in December 2015. She also has rheumatoid arthritis.  Past therapy: She is status post IV iron for a total of 1 g of Feraheme given on 09/10/2015 and 09/17/2015.  Current therapy: Oral iron supplements. 325 mg ferrous sulfate once daily.  Interim History:  [name] presents today for a follow-up visit accompanied by family members and an interpreter. Since her last visit, she reports feeling well. She still taks oral iron without any major complications. She denied any nausea, dyspepsia or constipation. Her energy and performance status remained stable. She denied any hematochezia, melena or menorrhagia. She is currently off methotrexate and Orencia for rheumatoid arthritis. She is currently taking Morrie Sheldon for her arthritis and seems to be helping for the time being. She did have one episode of dizziness and presyncope although she did not fall or have a full syncopal episode. This issue is resolved at this time.  She does not report any headaches or blurry vision or syncope. Does not report any fevers or chills or sweats. Does not report any weight loss or constitutional symptoms. She does not report any nausea, vomiting, abdominal pain or hematochezia. She does not report any frequency urgency or hesitancy. Rest of her review of systems unremarkable.   Medications: I have reviewed the patient's current medications.  Current Outpatient Prescriptions  Medication Sig Dispense Refill  . ferrous sulfate 325 (65 FE) MG EC tablet Take 1 tablet (325 mg total) by mouth 3 (three) times daily with meals. 90 tablet 3  . folic acid (FOLVITE) 1 MG tablet Take 1 tablet by mouth daily.    Marland Kitchen lisinopril-hydrochlorothiazide (PRINZIDE,ZESTORETIC)  10-12.5 MG tablet Take 1 tablet by mouth daily.  0  . Methotrexate Sodium (METHOTREXATE, PF,) 200 MG/8ML injection Inject 0.8 mLs into the skin every 7 (seven) days.     Marland Kitchen ORENCIA CLICKJECT 735 MG/ML SOAJ Inject 1 Syringe into the skin every 7 (seven) days.  11  . oxyCODONE-acetaminophen (PERCOCET) 5-325 MG tablet Take 1-2 tablets by mouth every 6 (six) hours as needed. 20 tablet 0  . predniSONE (DELTASONE) 10 MG tablet Take 10 mg by mouth daily with breakfast.     No current facility-administered medications for this visit.      Allergies: No Known Allergies  Past Medical History, Surgical history, Social history, and Family History were reviewed and updated.  Physical Exam: Blood pressure 123/72, pulse 99, temperature 98.2 F (36.8 C), temperature source Oral, resp. rate (!) 97, height 5\' 2"  (1.575 m), weight 199 lb 12.8 oz (90.6 kg), SpO2 97 %. ECOG: 0 General appearance: Alert, awake woman without distress. Head: Normocephalic, without obvious abnormality no oral ulcers or lesions. Neck: no adenopathy Lymph nodes: Cervical, supraclavicular, and axillary nodes normal. Heart:regular rate and rhythm, S1, S2 normal, no murmur, click, rub or gallop Lung:chest clear, no wheezing, rales, normal symmetric air entry Abdomin: soft, non-tender, without masses or organomegaly no rebound or guarding. EXT:no erythema, induration, or nodules   Lab Results: Lab Results  Component Value Date   WBC 12.3 (H) 06/09/2017   HGB 12.1 06/09/2017   HCT 35.3 06/09/2017   MCV 92.3 06/09/2017   PLT 491 (H) 06/09/2017     Chemistry      Component Value Date/Time   NA 140 06/10/2016 1303   K 4.0  06/10/2016 1303   CL 101 08/25/2014 0426   CO2 30 (H) 06/10/2016 1303   BUN 10.3 06/10/2016 1303   CREATININE 0.8 06/10/2016 1303      Component Value Date/Time   CALCIUM 9.8 06/10/2016 1303   ALKPHOS 76 06/10/2016 1303   AST 12 06/10/2016 1303   ALT 14 06/10/2016 1303   BILITOT 0.61 06/10/2016 1303      Results for RENTERIA KARSYN, JAMIE (MRN [id number]) as of 06/09/2017 15:01  Ref. Range 12/09/2016 13:13  Iron Latest Ref Range: 41 - 142 ug/dL 249 (H)  UIBC Latest Ref Range: 120 - 384 ug/dL 113 (L)  TIBC Latest Ref Range: 236 - 444 ug/dL 363  %SAT Latest Ref Range: 21 - 57 % 69 (H)  Ferritin Latest Ref Range: 9 - 269 ng/ml 6 (L)     Impression and Plan:  41 year old woman with the following issues:  1. Iron deficiency anemia: Diagnosed in December 2015 related due to menstrual losses and irregular periods. She have been erratic and her oral iron intake and status post IV iron in December 2016.  Her hemoglobin is normal at this time and she has been taking oral iron on a daily basis. Her iron studies are currently pending.  I have recommended continue oral iron replacement 3 times a day and we will check her counts in 6 months. Intravenous iron will be used in the future if her iron drifts despite oral iron replacement.  2. Leukocytosis: Reactive in nature related to rheumatoid arthritis. White cell count continues to be stable.  3. Rheumatoid arthritis: She continues to follow with rheumatology and appears to be under control.  4. Follow-up: Will be in 6 months to check on her clinical status and repeat her iron studies.   [name], MD 9/13/20183:27 PM

## 2017-06-10 LAB — IRON AND TIBC
%SAT: 17 % — ABNORMAL LOW (ref 21–57)
IRON: 41 ug/dL (ref 41–142)
TIBC: 248 ug/dL (ref 236–444)
UIBC: 206 ug/dL (ref 120–384)

## 2017-06-10 LAB — FERRITIN: Ferritin: 45 ng/ml (ref 9–269)

## 2017-12-07 ENCOUNTER — Ambulatory Visit: Payer: Self-pay | Admitting: Oncology

## 2017-12-07 ENCOUNTER — Other Ambulatory Visit: Payer: Self-pay

## 2017-12-15 ENCOUNTER — Other Ambulatory Visit: Payer: Self-pay | Admitting: Obstetrics & Gynecology

## 2017-12-15 DIAGNOSIS — Z1231 Encounter for screening mammogram for malignant neoplasm of breast: Secondary | ICD-10-CM

## 2020-12-07 ENCOUNTER — Encounter (HOSPITAL_COMMUNITY): Payer: Self-pay

## 2020-12-07 ENCOUNTER — Emergency Department (HOSPITAL_COMMUNITY)
Admission: EM | Admit: 2020-12-07 | Discharge: 2020-12-07 | Disposition: A | Discharge status: Home / Self Care | Payer: Self-pay | Attending: Emergency Medicine | Admitting: Emergency Medicine

## 2020-12-07 DIAGNOSIS — R110 Nausea: Secondary | ICD-10-CM | POA: Insufficient documentation

## 2020-12-07 DIAGNOSIS — E876 Hypokalemia: Secondary | ICD-10-CM | POA: Insufficient documentation

## 2020-12-07 DIAGNOSIS — I10 Essential (primary) hypertension: Secondary | ICD-10-CM | POA: Insufficient documentation

## 2020-12-07 DIAGNOSIS — H81399 Other peripheral vertigo, unspecified ear: Secondary | ICD-10-CM | POA: Insufficient documentation

## 2020-12-07 DIAGNOSIS — R519 Headache, unspecified: Secondary | ICD-10-CM | POA: Insufficient documentation

## 2020-12-07 DIAGNOSIS — Z79899 Other long term (current) drug therapy: Secondary | ICD-10-CM | POA: Insufficient documentation

## 2020-12-07 LAB — CBC WITH DIFFERENTIAL/PLATELET
Abs Immature Granulocytes: 0.08 10*3/uL — ABNORMAL HIGH (ref 0.00–0.07)
Basophils Absolute: 0 10*3/uL (ref 0.0–0.1)
Basophils Relative: 0 %
Eosinophils Absolute: 0 10*3/uL (ref 0.0–0.5)
Eosinophils Relative: 0 %
HCT: 37.9 % (ref 36.0–46.0)
Hemoglobin: 12.1 g/dL (ref 12.0–15.0)
Immature Granulocytes: 1 %
Lymphocytes Relative: 10 %
Lymphs Abs: 1 10*3/uL (ref 0.7–4.0)
MCH: 29.8 pg (ref 26.0–34.0)
MCHC: 31.9 g/dL (ref 30.0–36.0)
MCV: 93.3 fL (ref 80.0–100.0)
Monocytes Absolute: 0.4 10*3/uL (ref 0.1–1.0)
Monocytes Relative: 4 %
Neutro Abs: 8.5 10*3/uL — ABNORMAL HIGH (ref 1.7–7.7)
Neutrophils Relative %: 85 %
Platelets: 525 10*3/uL — ABNORMAL HIGH (ref 150–400)
RBC: 4.06 MIL/uL (ref 3.87–5.11)
RDW: 14.7 % (ref 11.5–15.5)
WBC: 10.1 10*3/uL (ref 4.0–10.5)
nRBC: 0 % (ref 0.0–0.2)

## 2020-12-07 LAB — BASIC METABOLIC PANEL
Anion gap: 11 (ref 5–15)
BUN: 12 mg/dL (ref 6–20)
CO2: 26 mmol/L (ref 22–32)
Calcium: 9 mg/dL (ref 8.9–10.3)
Chloride: 101 mmol/L (ref 98–111)
Creatinine, Ser: 0.84 mg/dL (ref 0.44–1.00)
GFR, Estimated: >60 mL/min (ref >60)
Glucose, Bld: 117 mg/dL — ABNORMAL HIGH (ref 70–99)
Potassium: 3.4 mmol/L — ABNORMAL LOW (ref 3.5–5.1)
Sodium: 138 mmol/L (ref 135–145)

## 2020-12-07 LAB — HCG, QUANTITATIVE, PREGNANCY: hCG, Beta Chain, Quant, S: <1 m[IU]/mL (ref <5)

## 2020-12-07 LAB — MAGNESIUM: Magnesium: 2 mg/dL (ref 1.7–2.4)

## 2020-12-07 MED ORDER — MECLIZINE HCL 25 MG PO TABS
25.0000 mg | ORAL_TABLET | Freq: Once | ORAL | Status: AC
  Administered 2020-12-07: 25 mg via ORAL
  Filled 2020-12-07: qty 1

## 2020-12-07 MED ORDER — POTASSIUM CHLORIDE CRYS ER 20 MEQ PO TBCR
40.0000 meq | EXTENDED_RELEASE_TABLET | Freq: Once | ORAL | Status: AC
  Administered 2020-12-07: 40 meq via ORAL
  Filled 2020-12-07: qty 2

## 2020-12-07 MED ORDER — MECLIZINE HCL 25 MG PO TABS: 25.0000 mg | ORAL_TABLET | Freq: Three times a day (TID) | ORAL | 0 refills | Status: AC | PRN

## 2020-12-07 NOTE — ED Triage Notes (Signed)
Patient arrives via EMS from home with complaint of dizziness. Patient reports she woke up around 4 am with dizziness that would not resolve. She reports having some sinus congestion over the last few days. No hx of vertigo, denies any N/V.   EMS vitals:  BP  130/100 HR 80  RR 18 Spo2 100% CBG 123

## 2020-12-07 NOTE — ED Provider Notes (Signed)
[facility] DEPT Provider Note   CSN: [id number] Arrival date & time: 12/07/20  0530   History Chief Complaint  Patient presents with  . Dizziness    Kayla Patton is a 45 y.o. female.  The history is provided by the patient. A language interpreter was used.  She has a history of hypertension, hyperlipidemia and comes in because of dizziness.  She woke up with a sense that the room was spinning.  She had difficulty walking to the bathroom.  There was mild nausea.  Earlier in the evening, her ears had been clogged and she had some ringing in her ears and her daughter had put some drops in the ear.  There is a mild headache.  She denies any hearing loss.  She has not had symptoms like this before.  Past Medical History:  Diagnosis Date  . Arthritis   . Hypertension     Patient Active Problem List   Diagnosis Date Noted  . Iron deficiency anemia 09/02/2015  . KNEE PAIN, LEFT 12/16/2009  . SIALADENITIS, RIGHT 11/12/2009  . ALLERGIC RHINITIS WITH CONJUNCTIVITIS 09/30/2009  . FATIGUE 09/30/2009  . 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 COMMON MIGRAINE 05/22/2007  . GERD 05/22/2007    Past Surgical History:  Procedure Laterality Date  . APPENDECTOMY       OB History   No obstetric history on file.     No family history on file.  Social History   Tobacco Use  . Smoking status: Never Smoker  . Smokeless tobacco: Never Used  Substance Use Topics  . Alcohol use: No  . Drug use: No    Home Medications Prior to Admission medications   Medication Sig Start Date End Date Taking? Authorizing Provider  Cholecalciferol (VITAMIN D-1000 MAX ST) 1000 units tablet Take 2 tablets by mouth daily. 01/10/17   [provider]  ferrous sulfate 325 (65 FE) MG EC tablet Take 1 tablet (325 mg total) by mouth 3 (three) times daily with meals. 06/09/17   Wyatt Portela, MD  folic acid (FOLVITE) 1 MG tablet Take 1 tablet by mouth daily.  12/24/15   [provider]  lisinopril-hydrochlorothiazide (PRINZIDE,ZESTORETIC) 10-12.5 MG tablet Take 1 tablet by mouth daily. 02/02/16   [provider]  predniSONE (DELTASONE) 10 MG tablet Take 10 mg by mouth daily with breakfast. 03/10/16   [provider]  Tofacitinib Citrate (XELJANZ) 5 MG TABS Take 1 tablet by mouth 2 (two) times daily. 06/09/17   [provider]    Allergies    Patient has no known allergies.  Review of Systems   Review of Systems  All other systems reviewed and are negative.   Physical Exam Updated Vital Signs There were no vitals taken for this visit.  Physical Exam Vitals and nursing note reviewed.   45 year old female, resting comfortably and in no acute distress. Vital signs are significant for elevated blood pressure. Oxygen saturation is 100%, which is normal. Head is normocephalic and atraumatic. PERRLA, EOMI. Rotary nystagmus is noted on lateral gaze, and reproduces her dizziness.  Oropharynx is clear.  Tympanic membranes are clear. Neck is nontender and supple without adenopathy or JVD. Back is nontender and there is no CVA tenderness. Lungs are clear without rales, wheezes, or rhonchi. Chest is nontender. Heart has regular rate and rhythm without murmur. Abdomen is soft, flat, nontender without masses or hepatosplenomegaly and peristalsis is normoactive. Extremities have no cyanosis or edema, full range of motion is  present. Skin is warm and dry without rash. Neurologic: Mental status is normal, cranial nerves are intact, there are no motor or sensory deficits.  Finger-to-nose test is normal.  Dizziness is reproduced by passive head movement.  ED Results / Procedures / Treatments   Labs (all labs ordered are listed, but only abnormal results are displayed) Labs Reviewed  BASIC METABOLIC PANEL - Abnormal; Notable for the following components:      Result Value   Potassium 3.4 (*)    Glucose, Bld 117 (*)    All  other components within normal limits  CBC WITH DIFFERENTIAL/PLATELET - Abnormal; Notable for the following components:   Platelets 525 (*)    Neutro Abs 8.5 (*)    Abs Immature Granulocytes 0.08 (*)    All other components within normal limits  MAGNESIUM  HCG, QUANTITATIVE, PREGNANCY   Procedures Procedures   Medications Ordered in ED Medications - No data to display  ED Course  I have reviewed the triage vital signs and the nursing notes.  Pertinent labs & imaging results that were available during my care of the patient were reviewed by me and considered in my medical decision making (see chart for details).  MDM Rules/Calculators/A&P Dizziness which appears to be peripheral vertigo.  No red flags to suggest more serious pathology.  Old records are reviewed, and she has no relevant past visits.  She will be given a therapeutic trial of meclizine and will check electrolytes.  Electrolytes show borderline low potassium and she is given a dose of oral potassium.  She had excellent relief of her vertigo with meclizine, and is discharged with prescription for same.  Final Clinical Impression(s) / ED Diagnoses Final diagnoses:  Peripheral vertigo, unspecified laterality  Hypokalemia    Rx / DC Orders ED Discharge Orders         Ordered    meclizine (ANTIVERT) 25 MG tablet  3 times daily PRN        12/07/20 9470           Delora Fuel, MD 96/28/36 0740

## 2022-06-04 ENCOUNTER — Emergency Department (HOSPITAL_COMMUNITY): Payer: Self-pay

## 2022-06-04 ENCOUNTER — Encounter (HOSPITAL_COMMUNITY): Payer: Self-pay | Admitting: Emergency Medicine

## 2022-06-04 ENCOUNTER — Emergency Department (HOSPITAL_COMMUNITY)
Admission: EM | Admit: 2022-06-04 | Discharge: 2022-06-05 | Disposition: A | Discharge status: Home / Self Care | Payer: Self-pay | Attending: Emergency Medicine | Admitting: Emergency Medicine

## 2022-06-04 ENCOUNTER — Other Ambulatory Visit: Payer: Self-pay

## 2022-06-04 DIAGNOSIS — R109 Unspecified abdominal pain: Secondary | ICD-10-CM

## 2022-06-04 DIAGNOSIS — R1011 Right upper quadrant pain: Secondary | ICD-10-CM

## 2022-06-04 DIAGNOSIS — I10 Essential (primary) hypertension: Secondary | ICD-10-CM | POA: Insufficient documentation

## 2022-06-04 DIAGNOSIS — K8020 Calculus of gallbladder without cholecystitis without obstruction: Secondary | ICD-10-CM | POA: Insufficient documentation

## 2022-06-04 LAB — CBC
HCT: 35.7 % — ABNORMAL LOW (ref 36.0–46.0)
Hemoglobin: 11.5 g/dL — ABNORMAL LOW (ref 12.0–15.0)
MCH: 27.3 pg (ref 26.0–34.0)
MCHC: 32.2 g/dL (ref 30.0–36.0)
MCV: 84.6 fL (ref 80.0–100.0)
Platelets: 545 10*3/uL — ABNORMAL HIGH (ref 150–400)
RBC: 4.22 MIL/uL (ref 3.87–5.11)
RDW: 14.3 % (ref 11.5–15.5)
WBC: 11.9 10*3/uL — ABNORMAL HIGH (ref 4.0–10.5)
nRBC: 0 % (ref 0.0–0.2)

## 2022-06-04 LAB — URINALYSIS, ROUTINE W REFLEX MICROSCOPIC
Bilirubin Urine: NEGATIVE
Glucose, UA: NEGATIVE mg/dL
Hgb urine dipstick: NEGATIVE
Ketones, ur: NEGATIVE mg/dL
Leukocytes,Ua: NEGATIVE
Nitrite: NEGATIVE
Protein, ur: NEGATIVE mg/dL
Specific Gravity, Urine: 1.006 (ref 1.005–1.030)
pH: 8 (ref 5.0–8.0)

## 2022-06-04 LAB — COMPREHENSIVE METABOLIC PANEL
ALT: 22 U/L (ref 0–44)
AST: 19 U/L (ref 15–41)
Albumin: 3 g/dL — ABNORMAL LOW (ref 3.5–5.0)
Alkaline Phosphatase: 95 U/L (ref 38–126)
Anion gap: 8 (ref 5–15)
BUN: 7 mg/dL (ref 6–20)
CO2: 24 mmol/L (ref 22–32)
Calcium: 8.7 mg/dL — ABNORMAL LOW (ref 8.9–10.3)
Chloride: 103 mmol/L (ref 98–111)
Creatinine, Ser: 0.6 mg/dL (ref 0.44–1.00)
GFR, Estimated: >60 mL/min (ref >60)
Glucose, Bld: 123 mg/dL — ABNORMAL HIGH (ref 70–99)
Potassium: 3.4 mmol/L — ABNORMAL LOW (ref 3.5–5.1)
Sodium: 135 mmol/L (ref 135–145)
Total Bilirubin: 0.5 mg/dL (ref 0.3–1.2)
Total Protein: 7.9 g/dL (ref 6.5–8.1)

## 2022-06-04 LAB — I-STAT BETA HCG BLOOD, ED (MC, WL, AP ONLY): I-stat hCG, quantitative: <5 m[IU]/mL (ref <5)

## 2022-06-04 MED ORDER — IOHEXOL 300 MG/ML  SOLN
100.0000 mL | Freq: Once | INTRAMUSCULAR | Status: AC | PRN
  Administered 2022-06-04: 100 mL via INTRAVENOUS

## 2022-06-04 MED ORDER — FENTANYL CITRATE PF 50 MCG/ML IJ SOSY
50.0000 ug | PREFILLED_SYRINGE | Freq: Once | INTRAMUSCULAR | Status: AC
  Administered 2022-06-04: 50 ug via INTRAVENOUS
  Filled 2022-06-04: qty 1

## 2022-06-04 MED ORDER — OXYCODONE-ACETAMINOPHEN 5-325 MG PO TABS: 1.0000 | ORAL_TABLET | Freq: Four times a day (QID) | ORAL | 0 refills | Status: AC | PRN

## 2022-06-04 MED ORDER — KETOROLAC TROMETHAMINE 15 MG/ML IJ SOLN
15.0000 mg | Freq: Once | INTRAMUSCULAR | Status: AC
  Administered 2022-06-04: 15 mg via INTRAVENOUS
  Filled 2022-06-04: qty 1

## 2022-06-04 NOTE — Discharge Instructions (Addendum)
Fue atendido en urgencias por dolor abdominal y de espalda.  Como comentamos, en su tomografa computarizada puedo ver que tiene clculos biliares. Cuando la vescula biliar est inflamada con clculos, puede ser increblemente doloroso.  Le estoy escribiendo una receta para algunos analgsicos y adjunto la informacin de contacto de la [place]. [place] para programar una cita si desea hablar sobre un procedimiento.  Contine controlando su estado y regrese a la sala de emergencias si presenta sntomas nuevos o que empeoran, como fiebre o vmitos. _____________________________________________________________ Kayla Patton were seen in the emergency department for abdominal and back pain.  As we discussed, on your CT scan I can see that you have gallstones. When your gallbladder is inflamed with stones, it can be incredibly painful.  I'm writing you a prescription for some pain medication and I have attached the contact information for the surgery clinic.  Please call them to schedule an appointment if you would like to discuss a procedure.  Continue to monitor how you're doing and return to the ER for new or worsening symptoms like fever or vomiting.

## 2022-06-04 NOTE — ED Triage Notes (Signed)
Patient complains of right flank pain that started three days ago. Patient also reports history of arthritis and complains of worsening pain through all of her joints.

## 2022-06-04 NOTE — ED Provider Notes (Signed)
[facility] EMERGENCY DEPARTMENT Provider Note   CSN: [id number] Arrival date & time: 06/04/22  1600     History  Chief Complaint  Patient presents with   Flank Pain    Kayla Patton is a 46 y.o. female with history of arthritis and HTN who presents to the emergency department complaining of right flank pain starting 3 days ago, as well as generalized body pain related to arthritis, specifically her knee and her wrist. She reports having a doctor's appointment next week for joint pain. She describes the flank pain as cramping with movement. She was taking prednisone, methotrexate, sulfasalazine for her arthritis. She was taking ibuprofen for her back, twice daily for the past 3 days.   The history is provided by the patient. The history is limited by a language barrier. A language interpreter was used.  Flank Pain Associated symptoms include abdominal pain. Pertinent negatives include no chest pain and no shortness of breath.       Home Medications Prior to Admission medications   Medication Sig Start Date End Date Taking? Authorizing Provider  oxyCODONE-acetaminophen (PERCOCET/ROXICET) 5-325 MG tablet Take 1 tablet by mouth every 6 (six) hours as needed for severe pain. 06/04/22  Yes Lashonda Sonneborn T, PA-C  Cholecalciferol (VITAMIN D-1000 MAX ST) 1000 units tablet Take 2 tablets by mouth daily. 01/10/17   [provider]  ferrous sulfate 325 (65 FE) MG EC tablet Take 1 tablet (325 mg total) by mouth 3 (three) times daily with meals. 06/09/17   Wyatt Portela, MD  folic acid (FOLVITE) 1 MG tablet Take 1 tablet by mouth daily. 12/24/15   [provider]  lisinopril-hydrochlorothiazide (PRINZIDE,ZESTORETIC) 10-12.5 MG tablet Take 1 tablet by mouth daily. 02/02/16   [provider]  meclizine (ANTIVERT) 25 MG tablet Take 1 tablet (25 mg total) by mouth 3 (three) times daily as needed for dizziness. 05/31/99   Delora Fuel, MD   predniSONE (DELTASONE) 10 MG tablet Take 10 mg by mouth daily with breakfast. 03/10/16   [provider]  Tofacitinib Citrate (XELJANZ) 5 MG TABS Take 1 tablet by mouth 2 (two) times daily. 06/09/17   [provider]      Allergies    Patient has no known allergies.    Review of Systems   Review of Systems  Constitutional:  Negative for chills and fever.  Respiratory:  Negative for shortness of breath.   Cardiovascular:  Negative for chest pain.  Gastrointestinal:  Positive for abdominal pain and constipation. Negative for nausea and vomiting.  Genitourinary:  Positive for decreased urine volume and flank pain. Negative for vaginal bleeding and vaginal discharge.  Musculoskeletal:  Positive for arthralgias.  All other systems reviewed and are negative.   Physical Exam Updated Vital Signs BP 114/75 (BP Location: Right Arm)   Pulse 97   Temp 99.2 F (37.3 C) (Oral)   Resp 18   SpO2 98%  Physical Exam Vitals and nursing note reviewed.  Constitutional:      Appearance: Normal appearance.  HENT:     Head: Normocephalic and atraumatic.  Eyes:     Conjunctiva/sclera: Conjunctivae normal.  Cardiovascular:     Rate and Rhythm: Normal rate and regular rhythm.  Pulmonary:     Effort: Pulmonary effort is normal. No respiratory distress.     Breath sounds: Normal breath sounds.  Abdominal:     General: There is no distension.     Palpations: Abdomen is soft.  Tenderness: There is abdominal tenderness in the right upper quadrant. There is right CVA tenderness and guarding.  Skin:    General: Skin is warm and dry.  Neurological:     General: No focal deficit present.     Mental Status: She is alert.     ED Results / Procedures / Treatments   Labs (all labs ordered are listed, but only abnormal results are displayed) Labs Reviewed  COMPREHENSIVE METABOLIC PANEL - Abnormal; Notable for the following components:      Result Value   Potassium 3.4 (*)     Glucose, Bld 123 (*)    Calcium 8.7 (*)    Albumin 3.0 (*)    All other components within normal limits  CBC - Abnormal; Notable for the following components:   WBC 11.9 (*)    Hemoglobin 11.5 (*)    HCT 35.7 (*)    Platelets 545 (*)    All other components within normal limits  URINALYSIS, ROUTINE W REFLEX MICROSCOPIC  I-STAT BETA HCG BLOOD, ED ([facility], [facility], [facility] ONLY)    EKG None  Radiology CT ABDOMEN PELVIS W CONTRAST  Result Date: 06/04/2022 CLINICAL DATA:  Right lower quadrant pain. EXAM: CT ABDOMEN AND PELVIS WITH CONTRAST TECHNIQUE: Multidetector CT imaging of the abdomen and pelvis was performed using the standard protocol following bolus administration of intravenous contrast. RADIATION DOSE REDUCTION: This exam was performed according to the departmental dose-optimization program which includes automated exposure control, adjustment of the mA and/or kV according to patient size and/or use of iterative reconstruction technique. CONTRAST:  141m OMNIPAQUE IOHEXOL 300 MG/ML  SOLN COMPARISON:  None Available. FINDINGS: Lower chest: No acute abnormality. Hepatobiliary: Gallstones are present. There is no biliary ductal dilatation. The liver is within normal limits. Pancreas: Unremarkable. No pancreatic ductal dilatation or surrounding inflammatory changes. Spleen: Normal in size without focal abnormality. Adrenals/Urinary Tract: Adrenal glands are unremarkable. Kidneys are normal, without renal calculi, focal lesion, or hydronephrosis. Bladder is unremarkable. Stomach/Bowel: Stomach is within normal limits. Appendix is not seen. No evidence of bowel wall thickening, distention, or inflammatory changes. Vascular/Lymphatic: No significant vascular findings are present. No enlarged abdominal or pelvic lymph nodes. Reproductive: 2 IUDs are seen within the uterus. The ovaries appear within normal limits. Other: No abdominal wall hernia or abnormality. No abdominopelvic ascites. Musculoskeletal: No  fracture is seen. IMPRESSION: 1. There are 2 IUD is located in the uterus. Please correlate clinically. 2. No other acute localizing process in the abdomen or pelvis. 3. Cholelithiasis. Electronically Signed   By: ARonney AstersM.D.   On: 06/04/2022 23:01    Procedures Procedures    Medications Ordered in ED Medications  ketorolac (TORADOL) 15 MG/ML injection 15 mg (15 mg Intravenous Given 06/04/22 2218)  fentaNYL (SUBLIMAZE) injection 50 mcg (50 mcg Intravenous Given 06/04/22 2217)  iohexol (OMNIPAQUE) 300 MG/ML solution 100 mL (100 mLs Intravenous Contrast Given 06/04/22 2245)    ED Course/ Medical Decision Making/ A&P                           Medical Decision Making Amount and/or Complexity of Data Reviewed Labs: ordered. Radiology: ordered.  Risk Prescription drug management.  This patient is a 46y.o. female  who presents to the ED for concern of flank pain.   Differential diagnoses prior to evaluation: The emergent differential diagnosis includes, but is not limited to,  AAA, renal artery/vein embolism/thrombosis, mesenteric ischemia, pyelonephritis, nephrolithiasis, cystitis, biliary colic, pancreatitis,  perforated peptic ulcer, appendicitis, diverticulitis, bowel obstruction, ectopic Pregnancy, PID/TOA, Ovarian cyst, Ovarian torsion. This is not an exhaustive differential.   Past Medical History / Co-morbidities: Rheumatoid arthritis, HTN  Physical Exam: Physical exam performed. The pertinent findings include: Normal vital signs. RUQ and right CVA tenderness with guarding. Abdomen soft.   Lab Tests/Imaging studies: I personally interpreted labs/imaging and the pertinent results include: WBC 11.9, albumin 3.0, negative pregnancy, urinalysis negative for hematuria or infection.  CT abdomen pelvis with cholelithiasis. I agree with the radiologist interpretation.  Medications: I ordered medication including fentanyl and toradol.  I have reviewed the patients home medicines and  have made adjustments as needed. On reevaluation patient states her pain has almost entirely resolved.    Disposition: After consideration of the diagnostic results and the patients response to treatment, I feel that emergency department workup does not suggest an emergent condition requiring admission or immediate intervention beyond what has been performed at this time. The plan is: discharge to home with short course of pain medication for symptomatic cholelithiasis and encourage surgery follow up. Resources provided. The patient is safe for discharge and has been instructed to return immediately for worsening symptoms, change in symptoms or any other concerns.   Final Clinical Impression(s) / ED Diagnoses Final diagnoses:  Symptomatic cholelithiasis  Right flank pain  RUQ pain    Rx / DC Orders ED Discharge Orders          Ordered    oxyCODONE-acetaminophen (PERCOCET/ROXICET) 5-325 MG tablet  Every 6 hours PRN        06/04/22 2351           Portions of this report may have been transcribed using voice recognition software. Every effort was made to ensure accuracy; however, inadvertent computerized transcription errors may be present.    Kateri Plummer, PA-C 06/04/22 [place], [place], DO 06/04/22 2355

## 2022-06-05 NOTE — ED Notes (Signed)
Discharge instructions reviewed with patient. Patient verbalized understanding of instructions. Follow-up care and medications were reviewed. Patient ambulatory with steady gait. VSS upon discharge.  ?
